# Patient Record
Sex: Female | Born: 1980 | Race: Black or African American | Hispanic: No | Marital: Married | Smoking: Former smoker
Health system: Southern US, Community
[De-identification: ages and names within clinical notes are randomized; demographics above are authoritative.]

## PROBLEM LIST (undated history)

## (undated) DIAGNOSIS — R002 Palpitations: Secondary | ICD-10-CM

## (undated) DIAGNOSIS — J9601 Acute respiratory failure with hypoxia: Secondary | ICD-10-CM

## (undated) DIAGNOSIS — M329 Systemic lupus erythematosus, unspecified: Secondary | ICD-10-CM

## (undated) DIAGNOSIS — R079 Chest pain, unspecified: Secondary | ICD-10-CM

## (undated) DIAGNOSIS — R609 Edema, unspecified: Secondary | ICD-10-CM

## (undated) DIAGNOSIS — I2699 Other pulmonary embolism without acute cor pulmonale: Secondary | ICD-10-CM

## (undated) DIAGNOSIS — I10 Essential (primary) hypertension: Secondary | ICD-10-CM

## (undated) DIAGNOSIS — D6859 Other primary thrombophilia: Secondary | ICD-10-CM

## (undated) HISTORY — DX: Palpitations: R00.2

## (undated) HISTORY — PX: OTHER SURGICAL HISTORY: SHX169

## (undated) HISTORY — PX: TONSILLECTOMY: SUR1361

## (undated) HISTORY — PX: CHOLECYSTECTOMY: SHX55

## (undated) HISTORY — DX: Edema, unspecified: R60.9

## (undated) HISTORY — DX: Acute respiratory failure with hypoxia: J96.01

## (undated) HISTORY — PX: ABDOMINAL HYSTERECTOMY: SHX81

## (undated) HISTORY — DX: Chest pain, unspecified: R07.9

## (undated) HISTORY — PX: HAND SURGERY: SHX662

---

## 2003-06-08 DIAGNOSIS — I2699 Other pulmonary embolism without acute cor pulmonale: Secondary | ICD-10-CM

## 2003-06-08 HISTORY — DX: Other pulmonary embolism without acute cor pulmonale: I26.99

## 2008-04-12 ENCOUNTER — Ambulatory Visit: Payer: Self-pay | Admitting: Hematology & Oncology

## 2008-05-13 LAB — PROTHROMBIN TIME: Prothrombin Time: 50.4 seconds — ABNORMAL HIGH (ref 11.6–15.2)

## 2008-05-13 LAB — PROTIME-INR

## 2008-05-27 ENCOUNTER — Ambulatory Visit: Payer: Self-pay | Admitting: Oncology

## 2008-05-27 LAB — PROTIME-INR
INR: 1.1 — ABNORMAL LOW (ref 2.00–3.50)
Protime: 13.2 Seconds (ref 10.6–13.4)

## 2008-07-29 ENCOUNTER — Ambulatory Visit: Payer: Self-pay | Admitting: Oncology

## 2008-09-30 ENCOUNTER — Emergency Department (HOSPITAL_COMMUNITY): Admission: EM | Admit: 2008-09-30 | Discharge: 2008-09-30 | Payer: Self-pay | Admitting: Emergency Medicine

## 2008-10-14 ENCOUNTER — Emergency Department (HOSPITAL_COMMUNITY): Admission: EM | Admit: 2008-10-14 | Discharge: 2008-10-14 | Payer: Self-pay | Admitting: Emergency Medicine

## 2010-09-15 LAB — URINALYSIS, ROUTINE W REFLEX MICROSCOPIC
Bilirubin Urine: NEGATIVE
Glucose, UA: NEGATIVE mg/dL
Ketones, ur: NEGATIVE mg/dL
pH: 7 (ref 5.0–8.0)

## 2010-09-15 LAB — COMPREHENSIVE METABOLIC PANEL
ALT: 19 U/L (ref 0–35)
AST: 17 U/L (ref 0–37)
CO2: 27 mEq/L (ref 19–32)
Calcium: 8.7 mg/dL (ref 8.4–10.5)
Chloride: 106 mEq/L (ref 96–112)
GFR calc Af Amer: >60 mL/min (ref >60)
GFR calc non Af Amer: >60 mL/min (ref >60)
Sodium: 138 mEq/L (ref 135–145)

## 2010-09-15 LAB — LIPASE, BLOOD: Lipase: 18 U/L (ref 11–59)

## 2010-09-15 LAB — URINE MICROSCOPIC-ADD ON

## 2010-09-15 LAB — DIFFERENTIAL
Eosinophils Absolute: 0.2 10*3/uL (ref 0.0–0.7)
Eosinophils Relative: 3 % (ref 0–5)
Lymphs Abs: 3 10*3/uL (ref 0.7–4.0)

## 2010-09-15 LAB — CBC
MCHC: 34 g/dL (ref 30.0–36.0)
RBC: 4.09 MIL/uL (ref 3.87–5.11)
WBC: 6.8 10*3/uL (ref 4.0–10.5)

## 2010-09-15 LAB — POCT PREGNANCY, URINE: Preg Test, Ur: NEGATIVE

## 2010-09-16 LAB — URINE MICROSCOPIC-ADD ON

## 2010-09-16 LAB — URINALYSIS, ROUTINE W REFLEX MICROSCOPIC
Nitrite: NEGATIVE
Specific Gravity, Urine: 1.009 (ref 1.005–1.030)
pH: 6.5 (ref 5.0–8.0)

## 2010-09-16 LAB — POCT PREGNANCY, URINE: Preg Test, Ur: NEGATIVE

## 2011-12-24 DIAGNOSIS — R002 Palpitations: Secondary | ICD-10-CM

## 2011-12-24 DIAGNOSIS — R609 Edema, unspecified: Secondary | ICD-10-CM

## 2011-12-24 HISTORY — DX: Edema, unspecified: R60.9

## 2011-12-24 HISTORY — DX: Palpitations: R00.2

## 2012-12-11 DIAGNOSIS — G5600 Carpal tunnel syndrome, unspecified upper limb: Secondary | ICD-10-CM | POA: Insufficient documentation

## 2013-07-16 DIAGNOSIS — E119 Type 2 diabetes mellitus without complications: Secondary | ICD-10-CM | POA: Insufficient documentation

## 2014-05-16 DIAGNOSIS — N200 Calculus of kidney: Secondary | ICD-10-CM | POA: Insufficient documentation

## 2014-05-18 DIAGNOSIS — J9601 Acute respiratory failure with hypoxia: Secondary | ICD-10-CM

## 2014-05-18 HISTORY — DX: Acute respiratory failure with hypoxia: J96.01

## 2014-06-19 DIAGNOSIS — Z86711 Personal history of pulmonary embolism: Secondary | ICD-10-CM | POA: Insufficient documentation

## 2014-08-10 DIAGNOSIS — G4733 Obstructive sleep apnea (adult) (pediatric): Secondary | ICD-10-CM | POA: Insufficient documentation

## 2015-04-24 ENCOUNTER — Emergency Department (INDEPENDENT_AMBULATORY_CARE_PROVIDER_SITE_OTHER)
Admission: EM | Admit: 2015-04-24 | Discharge: 2015-04-24 | Disposition: A | Discharge status: Home / Self Care | Payer: Medicare Other | Source: Home / Self Care | Attending: Family Medicine | Admitting: Family Medicine

## 2015-04-24 ENCOUNTER — Encounter (HOSPITAL_COMMUNITY): Payer: Self-pay | Admitting: *Deleted

## 2015-04-24 DIAGNOSIS — K0889 Other specified disorders of teeth and supporting structures: Secondary | ICD-10-CM | POA: Diagnosis not present

## 2015-04-24 HISTORY — DX: Essential (primary) hypertension: I10

## 2015-04-24 HISTORY — DX: Other pulmonary embolism without acute cor pulmonale: I26.99

## 2015-04-24 MED ORDER — AMOXICILLIN 500 MG PO CAPS: 500.0000 mg | ORAL_CAPSULE | Freq: Three times a day (TID) | ORAL | Status: DC

## 2015-04-24 MED ORDER — HYDROCODONE-ACETAMINOPHEN 5-325 MG PO TABS: 1.0000 | ORAL_TABLET | Freq: Four times a day (QID) | ORAL | Status: DC | PRN

## 2015-04-24 NOTE — Discharge Instructions (Signed)
Take medicine as prescribed, see your dentist as soon as possible °

## 2015-04-24 NOTE — ED Notes (Signed)
l  Lower  Lower  Bottom  Toothache    sensative  To  Touch  And       tempature

## 2015-04-24 NOTE — ED Provider Notes (Signed)
CSN: [id number]     Arrival date & time 04/24/15  1929 History   First MD Initiated Contact with Patient 04/24/15 1946     Chief Complaint  Patient presents with  . Dental Pain   (Consider location/radiation/quality/duration/timing/severity/associated sxs/prior Treatment) Patient is a 34 y.o. female presenting with tooth pain. The history is provided by the patient.  Dental Pain Location:  Lower Lower teeth location:  21/LL 1st bicuspid Quality:  Throbbing Severity:  Moderate Onset quality:  Sudden Duration:  3 days Progression:  Worsening Chronicity:  New Context: abscess, dental caries and poor dentition   Relieved by:  None tried Worsened by:  Nothing tried Ineffective treatments:  None tried Associated symptoms: no facial pain, no facial swelling, no fever, no neck swelling and no oral lesions   Risk factors: lack of dental care     Past Medical History  Diagnosis Date  . Pulmonary embolism (HCC)   . Hypertension    Past Surgical History  Procedure Laterality Date  . Btl    . Abdominal hysterectomy    . Cholecystectomy    . Tonsillectomy    . Hand surgery     No family history on file. Social History  Substance Use Topics  . Smoking status: Former Games developermoker  . Smokeless tobacco: None  . Alcohol Use: No   OB History    No data available     Review of Systems  Constitutional: Negative.  Negative for fever.  HENT: Positive for dental problem. Negative for facial swelling and mouth sores.   All other systems reviewed and are negative.   Allergies  Nsaids  Home Medications   Prior to Admission medications   Medication Sig Start Date End Date Taking? Authorizing Provider  AmLODIPine Besylate (NORVASC PO) Take by mouth.   Yes Historical Provider, MD  QUEtiapine Fumarate (SEROQUEL PO) Take by mouth.   Yes Historical Provider, MD  WARFARIN SODIUM PO Take by mouth.   Yes Historical Provider, MD  Zolpidem Tartrate (AMBIEN PO) Take by mouth.   Yes Historical  Provider, MD  amoxicillin (AMOXIL) 500 MG capsule Take 1 capsule (500 mg total) by mouth 3 (three) times daily. 04/24/15   Linna HoffJames D Amaurie Wandel, MD  HYDROcodone-acetaminophen (NORCO/VICODIN) 5-325 MG tablet Take 1 tablet by mouth every 6 (six) hours as needed. For dental pain 04/24/15   Linna HoffJames D Chavis Tessler, MD   Meds Ordered and Administered this Visit  Medications - No data to display  BP 164/84 mmHg  Pulse 102  Temp(Src) 98.2 F (36.8 C) (Oral)  Resp 16  SpO2 98% No data found.   Physical Exam  Constitutional: She is oriented to person, place, and time. She appears well-developed and well-nourished. She appears distressed.  HENT:  Right Ear: External ear normal.  Left Ear: External ear normal.  Mouth/Throat: Uvula is midline, oropharynx is clear and moist and mucous membranes are normal.    Neck: Normal range of motion. Neck supple.  Lymphadenopathy:    She has no cervical adenopathy.  Neurological: She is alert and oriented to person, place, and time.  Skin: Skin is warm and dry.  Nursing note and vitals reviewed.   ED Course  Procedures (including critical care time)  Labs Review Labs Reviewed - No data to display  Imaging Review No results found.   Visual Acuity Review  Right Eye Distance:   Left Eye Distance:   Bilateral Distance:    Right Eye Near:   Left Eye Near:    Bilateral  Near:         MDM   1. Pain, dental        Linna Hoff, MD 04/24/15 2002

## 2015-05-12 ENCOUNTER — Emergency Department (HOSPITAL_COMMUNITY): Payer: Medicare Other

## 2015-05-12 ENCOUNTER — Observation Stay (HOSPITAL_COMMUNITY)
Admission: EM | Admit: 2015-05-12 | Discharge: 2015-05-14 | Disposition: A | Discharge status: Home / Self Care | Payer: Medicare Other | Attending: Internal Medicine | Admitting: Internal Medicine

## 2015-05-12 ENCOUNTER — Encounter (HOSPITAL_COMMUNITY): Payer: Self-pay | Admitting: Emergency Medicine

## 2015-05-12 DIAGNOSIS — M329 Systemic lupus erythematosus, unspecified: Secondary | ICD-10-CM | POA: Diagnosis present

## 2015-05-12 DIAGNOSIS — I10 Essential (primary) hypertension: Secondary | ICD-10-CM | POA: Diagnosis present

## 2015-05-12 DIAGNOSIS — R079 Chest pain, unspecified: Secondary | ICD-10-CM | POA: Diagnosis present

## 2015-05-12 DIAGNOSIS — Z7901 Long term (current) use of anticoagulants: Secondary | ICD-10-CM

## 2015-05-12 DIAGNOSIS — E669 Obesity, unspecified: Secondary | ICD-10-CM | POA: Insufficient documentation

## 2015-05-12 DIAGNOSIS — Z86711 Personal history of pulmonary embolism: Secondary | ICD-10-CM | POA: Insufficient documentation

## 2015-05-12 DIAGNOSIS — D6859 Other primary thrombophilia: Secondary | ICD-10-CM | POA: Diagnosis not present

## 2015-05-12 DIAGNOSIS — Z87891 Personal history of nicotine dependence: Secondary | ICD-10-CM | POA: Diagnosis not present

## 2015-05-12 DIAGNOSIS — Z79899 Other long term (current) drug therapy: Secondary | ICD-10-CM | POA: Insufficient documentation

## 2015-05-12 HISTORY — DX: Morbid (severe) obesity due to excess calories: E66.01

## 2015-05-12 HISTORY — DX: Other primary thrombophilia: D68.59

## 2015-05-12 HISTORY — DX: Systemic lupus erythematosus, unspecified: M32.9

## 2015-05-12 LAB — BASIC METABOLIC PANEL
ANION GAP: 5 (ref 5–15)
BUN: 9 mg/dL (ref 6–20)
CALCIUM: 9.2 mg/dL (ref 8.9–10.3)
CO2: 27 mmol/L (ref 22–32)
Chloride: 107 mmol/L (ref 101–111)
Creatinine, Ser: 0.95 mg/dL (ref 0.44–1.00)
GLUCOSE: 101 mg/dL — AB (ref 65–99)
POTASSIUM: 4 mmol/L (ref 3.5–5.1)
Sodium: 139 mmol/L (ref 135–145)

## 2015-05-12 LAB — I-STAT TROPONIN, ED: TROPONIN I, POC: 0 ng/mL (ref 0.00–0.08)

## 2015-05-12 LAB — CBC
HEMATOCRIT: 35.8 % — AB (ref 36.0–46.0)
HEMOGLOBIN: 11.5 g/dL — AB (ref 12.0–15.0)
MCH: 28.8 pg (ref 26.0–34.0)
MCHC: 32.1 g/dL (ref 30.0–36.0)
MCV: 89.7 fL (ref 78.0–100.0)
Platelets: 282 10*3/uL (ref 150–400)
RBC: 3.99 MIL/uL (ref 3.87–5.11)
RDW: 15.5 % (ref 11.5–15.5)
WBC: 8.4 10*3/uL (ref 4.0–10.5)

## 2015-05-12 NOTE — ED Notes (Signed)
Pt. reports intermittent left chest pain with SOB , occasional dry cough and and nausea onset last night, denies diaphoresis .

## 2015-05-13 ENCOUNTER — Encounter (HOSPITAL_COMMUNITY): Payer: Self-pay | Admitting: General Practice

## 2015-05-13 ENCOUNTER — Observation Stay (HOSPITAL_COMMUNITY): Payer: Medicare Other

## 2015-05-13 ENCOUNTER — Emergency Department (HOSPITAL_COMMUNITY): Payer: Medicare Other

## 2015-05-13 DIAGNOSIS — I10 Essential (primary) hypertension: Secondary | ICD-10-CM | POA: Diagnosis not present

## 2015-05-13 DIAGNOSIS — Z7901 Long term (current) use of anticoagulants: Secondary | ICD-10-CM

## 2015-05-13 DIAGNOSIS — M329 Systemic lupus erythematosus, unspecified: Secondary | ICD-10-CM | POA: Diagnosis present

## 2015-05-13 DIAGNOSIS — D6859 Other primary thrombophilia: Secondary | ICD-10-CM

## 2015-05-13 DIAGNOSIS — R079 Chest pain, unspecified: Secondary | ICD-10-CM | POA: Diagnosis not present

## 2015-05-13 DIAGNOSIS — R0789 Other chest pain: Secondary | ICD-10-CM | POA: Diagnosis not present

## 2015-05-13 DIAGNOSIS — G4700 Insomnia, unspecified: Secondary | ICD-10-CM

## 2015-05-13 HISTORY — DX: Chest pain, unspecified: R07.9

## 2015-05-13 LAB — TROPONIN I
Troponin I: <0.03 ng/mL (ref <0.031)
Troponin I: <0.03 ng/mL (ref <0.031)

## 2015-05-13 LAB — LIPID PANEL
CHOL/HDL RATIO: 5.6 ratio
Cholesterol: 180 mg/dL (ref 0–200)
HDL: 32 mg/dL — AB (ref >40)
LDL CALC: 133 mg/dL — AB (ref 0–99)
TRIGLYCERIDES: 77 mg/dL (ref <150)
VLDL: 15 mg/dL (ref 0–40)

## 2015-05-13 LAB — MRSA PCR SCREENING: MRSA BY PCR: NEGATIVE

## 2015-05-13 LAB — SEDIMENTATION RATE
SED RATE: 23 mm/h — AB (ref 0–22)
Sed Rate: 17 mm/hr (ref 0–22)

## 2015-05-13 LAB — C-REACTIVE PROTEIN
CRP: 0.6 mg/dL (ref <1.0)
CRP: <0.5 mg/dL (ref <1.0)

## 2015-05-13 LAB — I-STAT BETA HCG BLOOD, ED (MC, WL, AP ONLY): I-stat hCG, quantitative: <5 m[IU]/mL (ref <5)

## 2015-05-13 LAB — PROTIME-INR
INR: 4.99 — ABNORMAL HIGH (ref 0.00–1.49)
PROTHROMBIN TIME: 44.9 s — AB (ref 11.6–15.2)

## 2015-05-13 MED ORDER — HYDROXYCHLOROQUINE SULFATE 200 MG PO TABS
200.0000 mg | ORAL_TABLET | Freq: Every day | ORAL | Status: DC
  Administered 2015-05-13 – 2015-05-14 (×2): 200 mg via ORAL
  Filled 2015-05-13 (×2): qty 1

## 2015-05-13 MED ORDER — IOHEXOL 350 MG/ML SOLN
100.0000 mL | Freq: Once | INTRAVENOUS | Status: AC | PRN
  Administered 2015-05-13: 100 mL via INTRAVENOUS

## 2015-05-13 MED ORDER — WARFARIN - PHARMACIST DOSING INPATIENT: Freq: Every day | Status: DC

## 2015-05-13 MED ORDER — NITROGLYCERIN 0.4 MG SL SUBL
0.4000 mg | SUBLINGUAL_TABLET | SUBLINGUAL | Status: DC | PRN
  Filled 2015-05-13 (×2): qty 1

## 2015-05-13 MED ORDER — ACETAMINOPHEN 325 MG PO TABS
650.0000 mg | ORAL_TABLET | ORAL | Status: DC | PRN
  Administered 2015-05-13 – 2015-05-14 (×3): 650 mg via ORAL
  Filled 2015-05-13 (×2): qty 2

## 2015-05-13 MED ORDER — SODIUM CHLORIDE 0.9 % IV SOLN
INTRAVENOUS | Status: DC
  Administered 2015-05-13 (×2): via INTRAVENOUS

## 2015-05-13 MED ORDER — IPRATROPIUM-ALBUTEROL 0.5-2.5 (3) MG/3ML IN SOLN
3.0000 mL | Freq: Once | RESPIRATORY_TRACT | Status: AC
  Administered 2015-05-13: 3 mL via RESPIRATORY_TRACT
  Filled 2015-05-13: qty 3

## 2015-05-13 MED ORDER — REGADENOSON 0.4 MG/5ML IV SOLN
0.4000 mg | Freq: Once | INTRAVENOUS | Status: AC
  Administered 2015-05-13: 0.4 mg via INTRAVENOUS
  Filled 2015-05-13: qty 5

## 2015-05-13 MED ORDER — ALBUTEROL SULFATE (2.5 MG/3ML) 0.083% IN NEBU: 2.5000 mg | INHALATION_SOLUTION | Freq: Four times a day (QID) | RESPIRATORY_TRACT | Status: DC | PRN

## 2015-05-13 MED ORDER — DIPHENHYDRAMINE HCL 50 MG/ML IJ SOLN
25.0000 mg | Freq: Once | INTRAMUSCULAR | Status: AC
  Administered 2015-05-13: 25 mg via INTRAVENOUS
  Filled 2015-05-13: qty 1

## 2015-05-13 MED ORDER — METOCLOPRAMIDE HCL 5 MG/ML IJ SOLN
10.0000 mg | Freq: Once | INTRAMUSCULAR | Status: AC
  Administered 2015-05-13: 10 mg via INTRAVENOUS
  Filled 2015-05-13: qty 2

## 2015-05-13 MED ORDER — AMLODIPINE BESYLATE 5 MG PO TABS
5.0000 mg | ORAL_TABLET | Freq: Every day | ORAL | Status: DC
  Administered 2015-05-13 – 2015-05-14 (×2): 5 mg via ORAL
  Filled 2015-05-13 (×2): qty 1

## 2015-05-13 MED ORDER — TECHNETIUM TC 99M SESTAMIBI GENERIC - CARDIOLITE
30.0000 | Freq: Once | INTRAVENOUS | Status: AC | PRN
  Administered 2015-05-13: 30 via INTRAVENOUS

## 2015-05-13 MED ORDER — METOPROLOL SUCCINATE ER 25 MG PO TB24
25.0000 mg | ORAL_TABLET | Freq: Every day | ORAL | Status: DC
  Administered 2015-05-13 – 2015-05-14 (×2): 25 mg via ORAL
  Filled 2015-05-13 (×2): qty 1

## 2015-05-13 MED ORDER — GI COCKTAIL ~~LOC~~
30.0000 mL | Freq: Four times a day (QID) | ORAL | Status: DC | PRN
  Administered 2015-05-13 – 2015-05-14 (×2): 30 mL via ORAL
  Filled 2015-05-13 (×2): qty 30

## 2015-05-13 MED ORDER — KETOROLAC TROMETHAMINE 30 MG/ML IJ SOLN
30.0000 mg | Freq: Once | INTRAMUSCULAR | Status: AC
  Administered 2015-05-13: 30 mg via INTRAVENOUS
  Filled 2015-05-13: qty 1

## 2015-05-13 MED ORDER — ACETAMINOPHEN 325 MG PO TABS
ORAL_TABLET | ORAL | Status: AC
  Filled 2015-05-13: qty 2

## 2015-05-13 MED ORDER — PREDNISONE 20 MG PO TABS
40.0000 mg | ORAL_TABLET | Freq: Every day | ORAL | Status: DC
  Administered 2015-05-14: 40 mg via ORAL
  Filled 2015-05-13: qty 2

## 2015-05-13 MED ORDER — REGADENOSON 0.4 MG/5ML IV SOLN
INTRAVENOUS | Status: AC
  Administered 2015-05-13: 0.4 mg via INTRAVENOUS
  Filled 2015-05-13: qty 5

## 2015-05-13 MED ORDER — ALBUTEROL SULFATE (2.5 MG/3ML) 0.083% IN NEBU
2.5000 mg | INHALATION_SOLUTION | Freq: Four times a day (QID) | RESPIRATORY_TRACT | Status: DC
  Filled 2015-05-13: qty 3

## 2015-05-13 MED ORDER — ZOLPIDEM TARTRATE 5 MG PO TABS
5.0000 mg | ORAL_TABLET | Freq: Every day | ORAL | Status: DC
  Administered 2015-05-13: 5 mg via ORAL
  Filled 2015-05-13: qty 1

## 2015-05-13 MED ORDER — QUETIAPINE FUMARATE 50 MG PO TABS
100.0000 mg | ORAL_TABLET | Freq: Every day | ORAL | Status: DC
  Administered 2015-05-13: 100 mg via ORAL
  Filled 2015-05-13: qty 2

## 2015-05-13 MED ORDER — MORPHINE SULFATE (PF) 2 MG/ML IV SOLN
2.0000 mg | INTRAVENOUS | Status: DC | PRN
  Administered 2015-05-13 (×4): 2 mg via INTRAVENOUS
  Filled 2015-05-13 (×4): qty 1

## 2015-05-13 MED ORDER — ONDANSETRON HCL 4 MG/2ML IJ SOLN: 4.0000 mg | Freq: Four times a day (QID) | INTRAMUSCULAR | Status: DC | PRN

## 2015-05-13 NOTE — ED Provider Notes (Signed)
CSN: [id number]     Arrival date & time 05/12/15  2159 History  By signing my name below, I, Freida Busmaniana Omoyeni, attest that this documentation has been prepared under the direction and in the presence of Tomasita CrumbleAdeleke Hikeem Andersson, MD . Electronically Signed: Freida Busmaniana Omoyeni, Scribe. 05/13/2015. 1:31 AM.  Chief Complaint  Patient presents with  . Chest Pain   The history is provided by the patient. No language interpreter was used.    HPI Comments:  Ezzie Duralierra P Dickens-Hunt is a 34 y.o. female who presents to the Emergency Department complaining of CP that began 2 nights ago (05/11/15). She describes chest tightness and "massive pounding" in her chest with movement. Pt notes she woke up yesterday AM (05/12/15) with associated LUE soreness and diaphoretic.  Pt also notes mild nausea. She denies vomiting. Her pain is exacerbated with deep inspiration.No alleviating factors noted. Pt denies recent surgery, long periods of immobilization and hormone replacement therapy. Pt reports h/o protein S deficiency and notes she is currently on coumadin secondary to  PE in 2005. She states her episode today is dissimilar to past PE. Pt is scheduled to have a stress test on 05/14/2015   Past Medical History  Diagnosis Date  . Pulmonary embolism (HCC)   . Hypertension   . Lupus (systemic lupus erythematosus) (HCC)   . Protein S deficiency (HCC)   . Obesity    Past Surgical History  Procedure Laterality Date  . Btl    . Abdominal hysterectomy    . Cholecystectomy    . Tonsillectomy    . Hand surgery     No family history on file. Social History  Substance Use Topics  . Smoking status: Former Games developermoker  . Smokeless tobacco: None  . Alcohol Use: No   OB History    No data available     Review of Systems  10 systems reviewed and all are negative for acute change except as noted in the HPI.    Allergies  Nsaids and Topamax  Home Medications   Prior to Admission medications   Medication Sig Start Date End Date Taking?  Authorizing Provider  amLODipine (NORVASC) 5 MG tablet Take 5 mg by mouth daily.   Yes Historical Provider, MD  metoprolol succinate (TOPROL-XL) 25 MG 24 hr tablet Take 25 mg by mouth daily.   Yes Historical Provider, MD  QUEtiapine (SEROQUEL) 100 MG tablet Take 100 mg by mouth at bedtime.   Yes Historical Provider, MD  warfarin (COUMADIN) 5 MG tablet Take 10-15 mg by mouth See admin instructions. Alternate taking 2 tablets one day then take 3 tablets the next day   Yes Historical Provider, MD  zolpidem (AMBIEN) 10 MG tablet Take 10 mg by mouth at bedtime.   Yes Historical Provider, MD  amoxicillin (AMOXIL) 500 MG capsule Take 1 capsule (500 mg total) by mouth 3 (three) times daily. Patient not taking: Reported on 05/13/2015 04/24/15   Linna HoffJames D Kindl, MD  HYDROcodone-acetaminophen (NORCO/VICODIN) 5-325 MG tablet Take 1 tablet by mouth every 6 (six) hours as needed. For dental pain Patient not taking: Reported on 05/13/2015 04/24/15   Linna HoffJames D Kindl, MD   BP 138/95 mmHg  Pulse 82  Temp(Src) 98.2 F (36.8 C) (Oral)  Resp 29  SpO2 99% Physical Exam  Constitutional: She is oriented to person, place, and time. No distress.  Obese  HENT:  Head: Normocephalic and atraumatic.  Nose: Nose normal.  Mouth/Throat: Oropharynx is clear and moist. No oropharyngeal exudate.  Eyes: Conjunctivae  and EOM are normal. Pupils are equal, round, and reactive to light. No scleral icterus.  Neck: Normal range of motion. Neck supple. No JVD present. No tracheal deviation present. No thyromegaly present.  Cardiovascular: Normal rate, regular rhythm and normal heart sounds.  Exam reveals no gallop and no friction rub.   No murmur heard. Pulmonary/Chest: Effort normal and breath sounds normal. No respiratory distress. She has no wheezes. She exhibits no tenderness.  Abdominal: Soft. Bowel sounds are normal. She exhibits no distension and no mass. There is no tenderness. There is no rebound and no guarding.   Musculoskeletal: Normal range of motion. She exhibits no edema or tenderness.  Lymphadenopathy:    She has no cervical adenopathy.  Neurological: She is alert and oriented to person, place, and time. No cranial nerve deficit. She exhibits normal muscle tone.  Skin: Skin is warm and dry. No rash noted. No erythema. No pallor.  Nursing note and vitals reviewed.   ED Course  Procedures   DIAGNOSTIC STUDIES:  Oxygen Saturation is 98% on RA, normal by my interpretation.    COORDINATION OF CARE:  1:31 AM Discussed treatment plan with pt at bedside and pt agreed to plan.  Labs Review Labs Reviewed  BASIC METABOLIC PANEL - Abnormal; Notable for the following:    Glucose, Bld 101 (*)    All other components within normal limits  CBC - Abnormal; Notable for the following:    Hemoglobin 11.5 (*)    HCT 35.8 (*)    All other components within normal limits  PROTIME-INR - Abnormal; Notable for the following:    Prothrombin Time 44.9 (*)    INR 4.99 (*)    All other components within normal limits  I-STAT TROPOININ, ED  I-STAT BETA HCG BLOOD, ED ([facility], [facility], [facility] ONLY)    Imaging Review Dg Chest 2 View  05/12/2015  CLINICAL DATA:  34 year old female with chest pain and shortness of breath EXAM: CHEST  2 VIEW COMPARISON:  Radiograph dated 10/14/2008 FINDINGS: The heart size and mediastinal contours are within normal limits. Both lungs are clear. The visualized skeletal structures are unremarkable. IMPRESSION: No active cardiopulmonary disease. Electronically Signed   By: Elgie Collard M.D.   On: 05/12/2015 22:58   Ct Angio Chest Pe W/cm &/or Wo Cm  05/13/2015  CLINICAL DATA:  34 year old female with intermittent chest pain and shortness of breath EXAM: CT ANGIOGRAPHY CHEST WITH CONTRAST TECHNIQUE: Multidetector CT imaging of the chest was performed using the standard protocol during bolus administration of intravenous contrast. Multiplanar CT image reconstructions and MIPs were obtained to  evaluate the vascular anatomy. CONTRAST:  OMNIPAQUE IOHEXOL 350 MG/ML SOLN COMPARISON:  Chest radiograph dated 05/12/2015 FINDINGS: The lungs are clear. No pleural effusion. The central airways are patent. The thoracic aorta appears unremarkable. Evaluation of the pulmonary arteries is limited due to suboptimal opacification of the peripheral branches. No central pulmonary artery embolus identified. There is prominence of the main pulmonary trunk concerning for a degree of pulmonary hypertension. Top-normal cardiac size. No pericardial effusion. The visualized esophagus and thyroid gland appear unremarkable. There is no axillary adenopathy. The chest wall soft tissues appear unremarkable. The osseous structures are intact. The visualized upper abdomen appear unremarkable. Review of the MIP images confirms the above findings. IMPRESSION: Suboptimal opacification of the peripheral pulmonary arteries. No CT evidence of central pulmonary artery embolus. Electronically Signed   By: Elgie Collard M.D.   On: 05/13/2015 01:55   I have personally reviewed and  evaluated these images and lab results as part of my medical decision-making.   EKG Interpretation   Date/Time:  Monday May 12 2015 22:00:14 EST Ventricular Rate:  89 PR Interval:  164 QRS Duration: 88 QT Interval:  360 QTC Calculation: 438 R Axis:   63 Text Interpretation:  Normal sinus rhythm Normal ECG No old tracing to  compare Confirmed by Erroll Luna [phone]) on 05/13/2015 12:30:29  AM      MDM   Final diagnoses:  None   Patient presents emergency department for chest pain. She states she has shortness of breath with it. She has seen a cardiologist in [place] was recommended she get a stress test performed on Wednesday. Her symptoms have gotten worse. She is compliant with her Coumadin however her INR is 5. CT scan of the chest was negative for pulmonary embolism.  Patient will need to be admitted for further cardiac  workup and also to have her INR stabilized. I have patient try hospitalist for admission.   I personally performed the services described in this documentation, which was scribed in my presence. The recorded information has been reviewed and is accurate.      Tomasita Crumble, MD 05/13/15 [phone]

## 2015-05-13 NOTE — Progress Notes (Signed)
ANTICOAGULATION CONSULT NOTE - Initial Consult  Pharmacy Consult for Coumadin Indication: PE 2005, prot S deficiency  Allergies  Allergen Reactions  . Nsaids Other (See Comments)    "not suppose to take due to being on blood thinners"  . Topamax [Topiramate] Other (See Comments)    "makes face numb"     Patient Measurements:    Vital Signs: Temp: 98.2 F (36.8 C) (12/05 2208) Temp Source: Oral (12/05 2208) BP: 120/75 mmHg (12/06 0300) Pulse Rate: 76 (12/06 0300)  Labs:  Recent Labs  05/12/15 2232 05/13/15 0054  HGB 11.5*  --   HCT 35.8*  --   PLT 282  --   LABPROT  --  44.9*  INR  --  4.99*  CREATININE 0.95  --     CrCl cannot be calculated (Unknown ideal weight.).   Medical History: Past Medical History  Diagnosis Date  . Pulmonary embolism (HCC)   . Hypertension   . Lupus (systemic lupus erythematosus) (HCC)   . Protein S deficiency (HCC)   . Obesity     Medications:  See electronic med rec  Assessment: 34 y.o. F presents with CP. Pt on coumadin PTA for h/o PE 2005, prot s deficiency. Home dose: 10mg  alternating with 15mg  - took 15mg  on 12/4. INR on admit 4.99. Noted that pt goal INR 3-4 (she reports this is her goal per her MD). CT chest neg for PE. CBC stable on admission.  Goal of Therapy:  INR 3-4 Monitor platelets by anticoagulation protocol: Yes   Plan:  Daily INR Hold coumadin 12/6 p.m.  Christoper Fabianaron Mikalia Fessel, PharmD, BCPS Clinical pharmacist, pager [phone] 05/13/2015,3:47 AM

## 2015-05-13 NOTE — Progress Notes (Addendum)
FYI - patient will be a 2 DAY NUCLEAR STRESS TEST. Today is day 1 of 2.  Results will be available tomorrow. Dr. Duke Salviaandolph feels patient can attempt exercise. Dayna Dunn PA-C

## 2015-05-13 NOTE — H&P (Addendum)
[facility] History and Physical  Shelley Bowman KCL:[id number] DOB: 14-Feb-1981 DOA: 05/12/2015  Referring physician: ED PCP: Pcp Not In System   Chief Complaint: Chest Pain  HPI:  Shelley Bowman is a 34 year old female with a past medical history significant for lupus, PE in 2005, protein S deficiency on chronic anticoagulation therapy, and hypertension; who presents with acute onset of chest pain. Symptoms started initially 2 nights ago. Describes symptoms as chest tightness on the left-hand side. Yesterday morning she woke up drenched in sweat. Patient reports taking her blood pressure medications with no relief of symptoms. Pain worsen with movement and taking deep breaths. She has not been able to try anything that has relieved symptoms. Associated symptoms include left arm swelling. Patient notes that she was scheduled to have a stress test on 05/14/2015. She is on Coumadin for a history of protein S deficiency and PE back in 2005. Her INR goal is between 3 and 4. She notes the only change in medications has been the addition of metoprolol which was started last week. Upon admission into the emergency department the patient was evaluated with a EKG (normal sinus rhythm), CT angiogram of the chest (showing no central pulmonary embolus), and troponin (negative).  Patient declined nitroglycerin secondary to side effects of headache.    Review of Systems  Constitutional: Positive for diaphoresis. Negative for chills.  HENT: Negative for ear pain and tinnitus.   Eyes: Negative for photophobia and pain.  Respiratory: Positive for shortness of breath. Negative for sputum production.   Cardiovascular: Positive for chest pain, palpitations and leg swelling.  Gastrointestinal: Negative for vomiting and abdominal pain.  Genitourinary: Negative for urgency and frequency.  Musculoskeletal: Positive for myalgias and joint pain.  Skin: Negative for itching and rash.  Neurological:  Negative for tremors, speech change and weakness.  Endo/Heme/Allergies: Negative for polydipsia. Bruises/bleeds easily.  Psychiatric/Behavioral: Negative for suicidal ideas and substance abuse.    Past Medical History  Diagnosis Date  . Pulmonary embolism ([place])   . Hypertension   . Lupus (systemic lupus erythematosus) ([place])   . Protein S deficiency ([place])   . Obesity      Past Surgical History  Procedure Laterality Date  . Btl    . Abdominal hysterectomy    . Cholecystectomy    . Tonsillectomy    . Hand surgery      Social History:  reports that she has quit smoking. She does not have any smokeless tobacco history on file. She reports that she does not drink alcohol or use illicit drugs. Where does patient live--home  Can patient participate in ADLs? Yes  Allergies  Allergen Reactions  . Nsaids Other (See Comments)    "not suppose to take due to being on blood thinners"  . Topamax [Topiramate] Other (See Comments)    "makes face numb"     No family history on file.      Prior to Admission medications   Medication Sig Start Date End Date Taking? Authorizing Provider  amLODipine (NORVASC) 5 MG tablet Take 5 mg by mouth daily.   Yes Historical Provider, MD  metoprolol succinate (TOPROL-XL) 25 MG 24 hr tablet Take 25 mg by mouth daily.   Yes Historical Provider, MD  QUEtiapine (SEROQUEL) 100 MG tablet Take 100 mg by mouth at bedtime.   Yes Historical Provider, MD  warfarin (COUMADIN) 5 MG tablet Take 10-15 mg by mouth See admin instructions. Alternate taking 2 tablets one day then take 3 tablets the next  day   Yes Historical Provider, MD  zolpidem (AMBIEN) 10 MG tablet Take 10 mg by mouth at bedtime.   Yes Historical Provider, MD  amoxicillin (AMOXIL) 500 MG capsule Take 1 capsule (500 mg total) by mouth 3 (three) times daily. Patient not taking: Reported on 05/13/2015 04/24/15   Billy Fischer, MD  HYDROcodone-acetaminophen (NORCO/VICODIN) 5-325 MG tablet Take 1 tablet by  mouth every 6 (six) hours as needed. For dental pain Patient not taking: Reported on 05/13/2015 04/24/15   Billy Fischer, MD     Physical Exam: Filed Vitals:   05/13/15 0101 05/13/15 0115 05/13/15 0230 05/13/15 0245  BP:  138/95 130/94 134/96  Pulse: 79 82 77 77  Temp:      TempSrc:      Resp: 26 29 21 17   SpO2: 100% 99% 100% 100%     Constitutional: Vital signs reviewed. Patient obese female in mild distress, but cooperative with exam. Alert and oriented x3.  Head: Normocephalic and atraumatic  Ear: TM normal bilaterally  Mouth: no erythema or exudates, MMM  Eyes: PERRL, EOMI, conjunctivae normal, No scleral icterus.  Neck: Supple, Trachea midline normal ROM, No JVD, mass, thyromegaly, or carotid bruit present.  Cardiovascular: RRR, S1 normal, S2 normal, no MRG, pulses symmetric and intact bilaterally. Pain not reproducible with palpation on physical exam  Pulmonary/Chest: CTAB, no wheezes, rales, or rhonchi  Abdominal: Soft. Non-tender, non-distended, bowel sounds are normal, no masses, organomegaly, or guarding present.  GU: no CVA tenderness Musculoskeletal: No joint deformities, erythema, or stiffness, ROM full and no nontender Ext: +1 edema of the left upper extremity with +2 pitting edema of the lower extremities bilaterally. No cyanosis, pulses palpable bilaterally (DP and PT)  Hematology: no cervical, inginal, or axillary adenopathy.  Neurological: A&O x3, Strenght is normal and symmetric bilaterally, cranial nerve II-XII are grossly intact, no focal motor deficit, sensory intact to light touch bilaterally.  Skin: Warm, dry and intact. No rash, cyanosis, or clubbing.  Psychiatric: Normal mood and affect. speech and behavior is normal. Judgment and thought content normal. Cognition and memory are normal.      Data Review   Micro Results No results found for this or any previous visit (from the past 240 hour(s)).  Radiology Reports Dg Chest 2 View  05/12/2015   CLINICAL DATA:  34 year old female with chest pain and shortness of breath EXAM: CHEST  2 VIEW COMPARISON:  Radiograph dated 10/14/2008 FINDINGS: The heart size and mediastinal contours are within normal limits. Both lungs are clear. The visualized skeletal structures are unremarkable. IMPRESSION: No active cardiopulmonary disease. Electronically Signed   By: Anner Crete M.D.   On: 05/12/2015 22:58   Ct Angio Chest Pe W/cm &/or Wo Cm  05/13/2015  CLINICAL DATA:  34 year old female with intermittent chest pain and shortness of breath EXAM: CT ANGIOGRAPHY CHEST WITH CONTRAST TECHNIQUE: Multidetector CT imaging of the chest was performed using the standard protocol during bolus administration of intravenous contrast. Multiplanar CT image reconstructions and MIPs were obtained to evaluate the vascular anatomy. CONTRAST:  170m OMNIPAQUE IOHEXOL 350 MG/ML SOLN COMPARISON:  Chest radiograph dated 05/12/2015 FINDINGS: The lungs are clear. No pleural effusion. The central airways are patent. The thoracic aorta appears unremarkable. Evaluation of the pulmonary arteries is limited due to suboptimal opacification of the peripheral branches. No central pulmonary artery embolus identified. There is prominence of the main pulmonary trunk concerning for a degree of pulmonary hypertension. Top-normal cardiac size. No pericardial effusion. The visualized esophagus  and thyroid gland appear unremarkable. There is no axillary adenopathy. The chest wall soft tissues appear unremarkable. The osseous structures are intact. The visualized upper abdomen appear unremarkable. Review of the MIP images confirms the above findings. IMPRESSION: Suboptimal opacification of the peripheral pulmonary arteries. No CT evidence of central pulmonary artery embolus. Electronically Signed   By: Anner Crete M.D.   On: 05/13/2015 01:55     CBC  Recent Labs Lab 05/12/15 2232  WBC 8.4  HGB 11.5*  HCT 35.8*  PLT 282  MCV 89.7  MCH 28.8   MCHC 32.1  RDW 15.5    Chemistries   Recent Labs Lab 05/12/15 2232  NA 139  K 4.0  CL 107  CO2 27  GLUCOSE 101*  BUN 9  CREATININE 0.95  CALCIUM 9.2   ------------------------------------------------------------------------------------------------------------------ CrCl cannot be calculated (Unknown ideal weight.). ------------------------------------------------------------------------------------------------------------------ No results for input(s): HGBA1C in the last 72 hours. ------------------------------------------------------------------------------------------------------------------ No results for input(s): CHOL, HDL, LDLCALC, TRIG, CHOLHDL, LDLDIRECT in the last 72 hours. ------------------------------------------------------------------------------------------------------------------ No results for input(s): TSH, T4TOTAL, T3FREE, THYROIDAB in the last 72 hours.  Invalid input(s): FREET3 ------------------------------------------------------------------------------------------------------------------ No results for input(s): VITAMINB12, FOLATE, FERRITIN, TIBC, IRON, RETICCTPCT in the last 72 hours.  Coagulation profile  Recent Labs Lab 05/13/15 0054  INR 4.99*    No results for input(s): DDIMER in the last 72 hours.  Cardiac Enzymes No results for input(s): CKMB, TROPONINI, MYOGLOBIN in the last 168 hours.  Invalid input(s): CK ------------------------------------------------------------------------------------------------------------------ Invalid input(s): POCBNP   CBG: No results for input(s): GLUCAP in the last 168 hours.     EKG: Independently reviewed. Normal sinus rhythm   Assessment/Plan  Acute chest pain: Patient with reports of acute tightness of the chest with associated symptoms of shortness of breath and inability to walk significant distances without chest pain. Heart score is approximately 4 given history of presentation and  risk factors. Initial troponins 0.0 EKG normal sinus rhythm. CTA of the chest showed no acute signs of a central pulmonary embolus -Admit to stepdown as patient reports active chest pain -Repeat EKG in a.m. -troponin's x3 q 6hrs -Check lipid panel -NPO for possible stress test in am -Nitroglycerin and morphine prn pain -Albuterol neb prn SOB     Lupus (systemic lupus erythematosus) : Patient is not on any medications for her history of lupus. There were some mild signs of swelling of the left upper extremity. However also noted a supratherapeutic INR. -Check ESR and CRP  Protein S deficiency on chronic anticoagulation therapy: INR goal is reported to be between 3 and 4. On presentation today her INR was elevated at 4.99. Patient notes taking 15 mg of the night previously. -Pharmacy to dose Coumadin  Hypertension: Stable -continue metoprolol and amlodipine  History of PE: Patient reports history of PE back in 2005 -See above  Insomnia -Continue Ambien  Code Status:   full Family Communication: bedside Disposition Plan: admit   Total time spent 55 minutes.Greater than 50% of this time was spent in counseling, explanation of diagnosis, planning of further management, and coordination of care  [facility] Pager [phone]  If 7PM-7AM, please contact night-coverage [email or web address] Password [email or web address] 05/13/2015, 3:04 AM

## 2015-05-13 NOTE — Consult Note (Signed)
Cardiology Consultation Note  Patient ID: Shelley Bowman, MRN: [id number], DOB/AGE: [birth year] 35 y.o. Admit date: 05/12/2015   Date of Consult: 05/13/2015 Primary Physician: Pcp Not In System Primary Cardiologist: Previously seen by [facility]  Chief Complaint: chest pain Reason for Consultation: chest pain  HPI: [name] is a 35 y/o F with history of PE in 2005 with subsequent diagnoses of lupus and protein S deficiency, obesity, HTN, DM (although not on home meds and glucose OK), morbid obesity and former tobacco abuse (11 years) who presented to [facility] with chest pain. She has no history of heart disease - reports a stress test about a year ago that was normal. She has had intermittent chest pain for several months and was scheduled for a stress test tomorrow at [facility]. INR is followed by her hematologist and per patient, goal INR 3-4. On Sunday night 12/4 while getting ready for bed she began to notice a sensation of tightness in chest. She went to sleep and periodically would awaken with sensation of heart pounding and night sweats. When she woke up her left arm was sore. She noticed that when she would move around the chest pain would return for several minutes. This was associated with SOB. It has also been worse with palpation of the chest wall as well as very slight movements in bed. She had recurrence of palpitations just a short while ago. Telemetry reveals NSR. Workup thus far notable for neg trop x 2, LDL 133, CRP 0.6, ESR 23, INR 4.99, Hgb 11.5, neg upreg. CXR nonacute and CTA did not show any central PEs (suboptimal opacification). VSS. FHx + CAD in a great grandfather, otherwise +HTN in father.  Past Medical History  Diagnosis Date  . Pulmonary embolism ([place]) 2005  . Hypertension   . Lupus (systemic lupus erythematosus) ([place])   . Protein S deficiency ([place])     a. goal INR 3-4 per patient.  . Obesity       Surgical History:  Past Surgical  History  Procedure Laterality Date  . Btl    . Abdominal hysterectomy    . Cholecystectomy    . Tonsillectomy    . Hand surgery       Home Meds: Prior to Admission medications   Medication Sig Start Date End Date Taking? Authorizing Provider  amLODipine (NORVASC) 5 MG tablet Take 5 mg by mouth daily.   Yes Historical Provider, MD  metoprolol succinate (TOPROL-XL) 25 MG 24 hr tablet Take 25 mg by mouth daily.   Yes Historical Provider, MD  QUEtiapine (SEROQUEL) 100 MG tablet Take 100 mg by mouth at bedtime.   Yes Historical Provider, MD  warfarin (COUMADIN) 5 MG tablet Take 10-15 mg by mouth See admin instructions. Alternate taking 2 tablets one day then take 3 tablets the next day   Yes Historical Provider, MD  zolpidem (AMBIEN) 10 MG tablet Take 10 mg by mouth at bedtime.   Yes Historical Provider, MD  amoxicillin (AMOXIL) 500 MG capsule Take 1 capsule (500 mg total) by mouth 3 (three) times daily. Patient not taking: Reported on 05/13/2015 04/24/15   Billy Fischer, MD  HYDROcodone-acetaminophen (NORCO/VICODIN) 5-325 MG tablet Take 1 tablet by mouth every 6 (six) hours as needed. For dental pain Patient not taking: Reported on 05/13/2015 04/24/15   Billy Fischer, MD    Inpatient Medications:  . albuterol  2.5 mg Nebulization Q6H  . amLODipine  5 mg Oral Daily  . metoprolol  succinate  25 mg Oral Daily  . QUEtiapine  100 mg Oral QHS  . Warfarin - Pharmacist Dosing Inpatient   Does not apply q1800  . zolpidem  5 mg Oral QHS   . sodium chloride 75 mL/hr at 05/13/15 9147    Allergies:  Allergies  Allergen Reactions  . Nsaids Other (See Comments)    "not suppose to take due to being on blood thinners"  . Topamax [Topiramate] Other (See Comments)    "makes face numb"     Social History   Social History  . Marital Status: Married    Spouse Name: N/A  . Number of Children: N/A  . Years of Education: N/A   Occupational History  . Not on file.   Social History Main Topics    . Smoking status: Former Research scientist (life sciences)  . Smokeless tobacco: Not on file     Comment: Smoked for 11 years, quit ~2012  . Alcohol Use: No  . Drug Use: No  . Sexual Activity: Not on file   Other Topics Concern  . Not on file   Social History Narrative     Family History  Problem Relation Age of Onset  . Hypertension Father      Review of Systems:No syncope. All other systems reviewed and are otherwise negative except as noted above.  Labs:  Recent Labs  05/13/15 0630  TROPONINI <0.03   Lab Results  Component Value Date   WBC 8.4 05/12/2015   HGB 11.5* 05/12/2015   HCT 35.8* 05/12/2015   MCV 89.7 05/12/2015   PLT 282 05/12/2015    Recent Labs Lab 05/12/15 2232  NA 139  K 4.0  CL 107  CO2 27  BUN 9  CREATININE 0.95  CALCIUM 9.2  GLUCOSE 101*   Lab Results  Component Value Date   CHOL 180 05/13/2015   HDL 32* 05/13/2015   LDLCALC 133* 05/13/2015   TRIG 77 05/13/2015   Radiology/Studies:  Dg Chest 2 View  05/12/2015  CLINICAL DATA:  34 year old female with chest pain and shortness of breath EXAM: CHEST  2 VIEW COMPARISON:  Radiograph dated 10/14/2008 FINDINGS: The heart size and mediastinal contours are within normal limits. Both lungs are clear. The visualized skeletal structures are unremarkable. IMPRESSION: No active cardiopulmonary disease. Electronically Signed   By: Anner Crete M.D.   On: 05/12/2015 22:58   Ct Angio Chest Pe W/cm &/or Wo Cm  05/13/2015  CLINICAL DATA:  34 year old female with intermittent chest pain and shortness of breath EXAM: CT ANGIOGRAPHY CHEST WITH CONTRAST TECHNIQUE: Multidetector CT imaging of the chest was performed using the standard protocol during bolus administration of intravenous contrast. Multiplanar CT image reconstructions and MIPs were obtained to evaluate the vascular anatomy. CONTRAST:  146m OMNIPAQUE IOHEXOL 350 MG/ML SOLN COMPARISON:  Chest radiograph dated 05/12/2015 FINDINGS: The lungs are clear. No pleural  effusion. The central airways are patent. The thoracic aorta appears unremarkable. Evaluation of the pulmonary arteries is limited due to suboptimal opacification of the peripheral branches. No central pulmonary artery embolus identified. There is prominence of the main pulmonary trunk concerning for a degree of pulmonary hypertension. Top-normal cardiac size. No pericardial effusion. The visualized esophagus and thyroid gland appear unremarkable. There is no axillary adenopathy. The chest wall soft tissues appear unremarkable. The osseous structures are intact. The visualized upper abdomen appear unremarkable. Review of the MIP images confirms the above findings. IMPRESSION: Suboptimal opacification of the peripheral pulmonary arteries. No CT evidence of central pulmonary artery  embolus. Electronically Signed   By: Anner Crete M.D.   On: 05/13/2015 01:55    Wt Readings from Last 3 Encounters:  05/13/15 291 lb 6.4 oz (132.178 kg)   EKG: NSR 89bpm, no acute ST-T changes  Physical Exam: Blood pressure 125/83, pulse 75, temperature 98.3 F (36.8 C), temperature source Oral, resp. rate 20, height 5' 4"  (1.626 m), weight 291 lb 6.4 oz (132.178 kg), SpO2 99 %. Body mass index is 49.99 kg/(m^2). General: Well developed morbidly obese AAF in no acute distress. Head: Normocephalic, atraumatic, sclera non-icteric, no xanthomas, nares are without discharge.  Neck: Negative for carotid bruits. JVD not elevated. Lungs: Clear bilaterally to auscultation without wheezes, rales, or rhonchi. Breathing is unlabored. Heart: RRR with S1 S2. No murmurs, rubs, or gallops appreciated. Chest wall diffusely TTP. Abdomen: Soft, non-tender, non-distended with normoactive bowel sounds. No hepatomegaly. No rebound/guarding. No obvious abdominal masses. Msk:  Strength and tone appear normal for age. Extremities: No clubbing or cyanosis. No edema.  Distal pedal pulses are 2+ and equal bilaterally. Neuro: Alert and oriented  X 3. No facial asymmetry. No focal deficit. Moves all extremities spontaneously. Psych:  Responds to questions appropriately with a normal affect.    Assessment and Plan:   1. Chest pain - troponin neg x 2 thus far, CXR/CTA nonacute. She was scheduled for stress test in [facility] tomorrow. She has had both atypical and typical features. Also note associated palpitations this AM with NSR on telemetry.  2. Lupus/protein S deficiency with history of PE - CTA neg for central PE. On anticoag, followed by hematology.  3. Essential HTN - controlled on present regimen.  4. Elevated LDL - may consider statin initiation for risk reduction.  Signed, Charlie Pitter PA-C 05/13/2015, 8:12 AM Pager: [phone]

## 2015-05-13 NOTE — Plan of Care (Signed)
Problem: Safety: Goal: Ability to remain free from injury will improve Outcome: Completed/Met Date Met:  05/13/15 Pt educated on safety measures put into place. Pt verbalized understanding.

## 2015-05-13 NOTE — Progress Notes (Signed)
Day 1 of 2 day lexiscan stress test completed without significant complication. Unfortunately patient states she cannot walk as she tried treadmill in the past. Stress portion done today, pending resting portion tomorrow, [facility].  Ramond DialSigned, Shann Merrick PA Pager: [phone]

## 2015-05-13 NOTE — ED Notes (Signed)
Offered nitro but patient declined. Prepared to go to CT.

## 2015-05-13 NOTE — ED Notes (Signed)
Admitting MD at the bedside.  

## 2015-05-13 NOTE — Progress Notes (Addendum)
PATIENT DETAILS Name: Shelley Bowman Age: 34 y.o. Sex: female Date of Birth: 1980-10-31 Admit Date: 05/12/2015 Admitting Physician Clydie Braun, MD PCP:Pcp Not In System  Subjective: Chest pain is reproducible. Left hand-multiple joints are tender but not swollen  Assessment/Plan: Chest pain:suspect atypical-troponin negative. Seen by cards-for 2 days Nuc stress test. Suspect-could be from arthritis-from underlying Lupus-will start prednisone taper-unable to use NSAID's given coumadin.  Hx of Lupus:has outpatient follow up with Rheum-non compliant with Plaquenil-restart-start steroid taper for athralgias.  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 On anticoag with coumadin (per patient target INR 2-3), followed by hematology.Coumadin being managed per pharmacy  Hypertension: Stable-continue metoprolol and amlodipine  Morbid obesity:counseled regarding importance of weight loss  Disposition: Remain inpatient-home when Nuc stress test neg  Antimicrobial agents  See below  Anti-infectives    Start     Dose/Rate Route Frequency Ordered Stop   05/13/15 1145  hydroxychloroquine (PLAQUENIL) tablet 200 mg     200 mg Oral Daily 05/13/15 1135        DVT Prophylaxis: Coumadin  Code Status: Full code   Family Communication None at bedside  Procedures: None  CONSULTS:  cardiology  Time spent 25 minutes-Greater than 50% of this time was spent in counseling, explanation of diagnosis, planning of further management, and coordination of care.  MEDICATIONS: Scheduled Meds: . amLODipine  5 mg Oral Daily  . hydroxychloroquine  200 mg Oral Daily  . metoprolol succinate  25 mg Oral Daily  . [START ON 05/14/2015] predniSONE  40 mg Oral Q breakfast  . QUEtiapine  100 mg Oral QHS  . regadenoson      . regadenoson  0.4 mg Intravenous Once  . Warfarin - Pharmacist Dosing Inpatient   Does not apply q1800  . zolpidem  5 mg Oral QHS    Continuous Infusions: . sodium chloride 75 mL/hr at 05/13/15 0619   PRN Meds:.acetaminophen, albuterol, gi cocktail, morphine injection, nitroGLYCERIN, ondansetron (ZOFRAN) IV    PHYSICAL EXAM: Vital signs in last 24 hours: Filed Vitals:   05/13/15 0430 05/13/15 0445 05/13/15 0527 05/13/15 0739  BP: 123/85 123/76 138/89 125/83  Pulse: 80 81 78 75  Temp:   98.4 F (36.9 C) 98.3 F (36.8 C)  TempSrc:   Oral Oral  Resp: Height:    (1.626 m)   Weight:   132.178 kg (291 lb 6.4 oz)   SpO2: 98% 97%  99%    Weight change:  Filed Weights   05/13/15 0527  Weight: 132.178 kg (291 lb 6.4 oz)   Body mass index is 49.99 kg/(m^2).   Gen Exam: Awake and alert with clear speech. Neck: Supple, No JVD.   Chest: B/L Clear.   CVS: S1 S2 Regular, no murmurs.  Abdomen: soft, BS +, non tender, non distended.  Extremities: no edema, lower extremities warm to touch. Neurologic: Non Focal.   Skin: No Rash.   Wounds: N/A.   Intake/Output from previous day: No intake or output data in the 24 hours ending 05/13/15 1135   LAB RESULTS: CBC  Recent Labs Lab 05/12/15 2232  WBC 8.4  HGB 11.5*  HCT 35.8*  PLT 282  MCV 89.7  MCH 28.8  MCHC 32.1  RDW 15.5    Chemistries   Recent Labs Lab 05/12/15 2232  NA 139  K 4.0  CL 107  CO2 27  GLUCOSE 101*  BUN 9  CREATININE 0.95  CALCIUM 9.2    CBG: No results for input(s): GLUCAP in the last 168 hours.  GFR Estimated Creatinine Clearance: 111.8 mL/min (by C-G formula based on Cr of 0.95).  Coagulation profile  Recent Labs Lab 05/13/15 0054  INR 4.99*    Cardiac Enzymes  Recent Labs Lab 05/13/15 0630 05/13/15 0830  TROPONINI <0.03 <0.03    Invalid input(s): POCBNP No results for input(s): DDIMER in the last 72 hours. No results for input(s): HGBA1C in the last 72 hours.  Recent Labs  05/13/15 0630  CHOL 180  HDL 32*  LDLCALC 133*  TRIG 77  CHOLHDL 5.6   No results for input(s): TSH,  T4TOTAL, T3FREE, THYROIDAB in the last 72 hours.  Invalid input(s): FREET3 No results for input(s): VITAMINB12, FOLATE, FERRITIN, TIBC, IRON, RETICCTPCT in the last 72 hours. No results for input(s): LIPASE, AMYLASE in the last 72 hours.  Urine Studies No results for input(s): UHGB, CRYS in the last 72 hours.  Invalid input(s): UACOL, UAPR, USPG, UPH, UTP, UGL, UKET, UBIL, UNIT, UROB, ULEU, UEPI, UWBC, URBC, UBAC, CAST, UCOM, BILUA  MICROBIOLOGY: Recent Results (from the past 240 hour(s))  MRSA PCR Screening     Status: None   Collection Time: 05/13/15  5:27 AM  Result Value Ref Range Status   MRSA by PCR NEGATIVE NEGATIVE Final    Comment:        The GeneXpert MRSA Assay (FDA approved for NASAL specimens only), is one component of a comprehensive MRSA colonization surveillance program. It is not intended to diagnose MRSA infection nor to guide or monitor treatment for MRSA infections.     RADIOLOGY STUDIES/RESULTS: Dg Chest 2 View  05/12/2015  CLINICAL DATA:  34 year old female with chest pain and shortness of breath EXAM: CHEST  2 VIEW COMPARISON:  Radiograph dated 10/14/2008 FINDINGS: The heart size and mediastinal contours are within normal limits. Both lungs are clear. The visualized skeletal structures are unremarkable. IMPRESSION: No active cardiopulmonary disease. Electronically Signed   By: Arash  Radparvar M.D.   On: 05/12/2015 22:58   Ct Angio Chest Pe W/cm &/or Wo Cm  05/13/2015  CLINICAL DATA:  34 year old female with intermittent chest pain and shortness of breath EXAM: CT ANGIOGRAPHY CHEST WITH CONTRAST TECHNIQUE: Multidetector CT imaging of the chest was performed using the standard protocol during bolus administration of intravenous contrast. Multiplanar CT image reconstructions and MIPs were obtained to evaluate the vascular anatomy. CONTRAST:  <MEASUREMENTPanorama <MEASUREMENLongs DrF78.2[place] [facility] <MEASUREMENLongs DrF78.2[place] mKoreEast[facility]<MEASUREMENLongs DrF78.2[place] mKoreB[facility] <MEASUREMENLongs DrF78.2[place] m[facility]ore[facility] <MEASUREMENLongs DrF78.2[place] mKoreCharl[facility] <MEASUREMENLongs DrF78.2[place] mKoreAdvocate Condell Medical Cent AWallenpaupack [place] <MEASUREMENLongs DrF78.2[place]26 mKorePhoenix [facility] <MEASUREMENLongs DrF78.2[place]36 mKoreNorth [facility] Vac[facility] AA<MEASUREMENLongs DrF78.2[place]6 mKoreWashington Hospit ACrawfo<MEASUREMENLongs DrF78.2[place]45 mKoreLanai Community HospitalAlaska [facility]arts IOHEXOL 350 MG/ML SOLN COMPARISON:  Chest radiograph dated 05/12/2015 FINDINGS: The lungs are clear. No  pleural effusion. The central airways are patent. The thoracic aorta appears unremarkable. Evaluation of the pulmonary arteries is limited due to suboptimal opacification of the peripheral branches. No central pulmonary artery embolus identified. There is prominence of the main pulmonary trunk concerning for a degree of pulmonary hypertension. Top-normal cardiac size. No pericardial effusion. The visualized esophagus and thyroid gland appear unremarkable. There is no axillary adenopathy. The chest wall soft tissues appear unremarkable. The osseous structures are intact. The visualized upper abdomen appear unremarkable. Review of the MIP images confirms the above findings. IMPRESSION: Suboptimal opacification of the peripheral pulmonary arteries. No CT evidence of central pulmonary artery embolus. Electronically Signed   By: Arash  Radparvar M.D.   On: 05/13/2015 01:55    GHIMIRE,SHANKER, MD  [facility] Pager:[phone]  If 7PM-7AM, please contact night-coverage [email or web address] Password [email or web address] 05/13/2015, 11:35 AM

## 2015-05-13 NOTE — Plan of Care (Signed)
Problem: Pain Managment: Goal: General experience of comfort will improve Outcome: Completed/Met Date Met:  05/13/15 Pt educated on pain scale and interventions. Pt verbalized understanding.

## 2015-05-13 NOTE — ED Notes (Signed)
Attempted report to 3E, consulted MD Katrinka BlazingSmith about telemetry vs Step down. Pt being changed to stepdown bed.

## 2015-05-14 ENCOUNTER — Ambulatory Visit (HOSPITAL_COMMUNITY): Payer: Medicare Other | Attending: Physician Assistant

## 2015-05-14 ENCOUNTER — Encounter (HOSPITAL_COMMUNITY): Payer: Medicare Other

## 2015-05-14 DIAGNOSIS — Z7901 Long term (current) use of anticoagulants: Secondary | ICD-10-CM

## 2015-05-14 DIAGNOSIS — R002 Palpitations: Secondary | ICD-10-CM

## 2015-05-14 DIAGNOSIS — I10 Essential (primary) hypertension: Secondary | ICD-10-CM

## 2015-05-14 DIAGNOSIS — R079 Chest pain, unspecified: Secondary | ICD-10-CM

## 2015-05-14 DIAGNOSIS — D6859 Other primary thrombophilia: Secondary | ICD-10-CM

## 2015-05-14 LAB — NM MYOCAR MULTI W/SPECT W/WALL MOTION / EF
CSEPED: 0 min
Estimated workload: 1 METS
Exercise duration (sec): 0 s
MPHR: 185 {beats}/min
Peak HR: 136 {beats}/min
Percent HR: 73 %
Rest HR: 80 {beats}/min

## 2015-05-14 LAB — PROTIME-INR
INR: 2.29 — ABNORMAL HIGH (ref 0.00–1.49)
Prothrombin Time: 25 seconds — ABNORMAL HIGH (ref 11.6–15.2)

## 2015-05-14 MED ORDER — WARFARIN - PHARMACIST DOSING INPATIENT: Freq: Every day | Status: DC

## 2015-05-14 MED ORDER — TECHNETIUM TC 99M SESTAMIBI GENERIC - CARDIOLITE
30.0000 | Freq: Once | INTRAVENOUS | Status: AC | PRN
  Administered 2015-05-14: 30 via INTRAVENOUS

## 2015-05-14 MED ORDER — WARFARIN SODIUM 10 MG PO TABS: 10.0000 mg | ORAL_TABLET | Freq: Once | ORAL | Status: DC

## 2015-05-14 MED ORDER — PREDNISONE 10 MG PO TABS: ORAL_TABLET | ORAL | Status: DC

## 2015-05-14 MED ORDER — HYDROXYCHLOROQUINE SULFATE 200 MG PO TABS: 200.0000 mg | ORAL_TABLET | Freq: Every day | ORAL | Status: AC

## 2015-05-14 MED ORDER — OXYCODONE HCL 5 MG PO TABS: 5.0000 mg | ORAL_TABLET | Freq: Four times a day (QID) | ORAL | Status: DC | PRN

## 2015-05-14 NOTE — Care Management Note (Signed)
Case Management Note  Patient Details  Name: Shelley Bowman MRN: [id number] Date of Birth: 07/15/1980  Subjective/Objective: Pt admitted for chest pain. Post 2 day stress test.                     Action/Plan: No PCP in the system. [place] CM did provide pt with the [facility] Connect Number for patient to call for PCP. No further needs from CM at this time.   Gala LewandowskyGraves-Bigelow, Camika Marsico Kaye, RN 05/14/2015, 1:39 PM

## 2015-05-14 NOTE — Care Management Obs Status (Signed)
MEDICARE OBSERVATION STATUS NOTIFICATION   Patient Details  Name: Shelley Bowman MRN: [id number] Date of Birth: 09/03/1980   Medicare Observation Status Notification Given:  Yes    Gala LewandowskyGraves-Bigelow, Seleny Allbright Kaye, RN 05/14/2015, 1:38 PM

## 2015-05-14 NOTE — Progress Notes (Signed)
ANTICOAGULATION CONSULT NOTE - Initial Consult  Pharmacy Consult for Coumadin Indication: PE 2005, prot S deficiency  Allergies  Allergen Reactions  . Nsaids Other (See Comments)    "not suppose to take due to being on blood thinners"  . Topamax [Topiramate] Other (See Comments)    "makes face numb"     Patient Measurements: Height: 5\' 4"  (162.6 cm) Weight: 291 lb 3.2 oz (132.087 kg) IBW/kg (Calculated) : 54.7  Vital Signs: Temp: 97.7 F (36.5 C) (12/07 0500) Temp Source: Oral (12/07 0500) BP: 104/57 mmHg (12/07 1003) Pulse Rate: 81 (12/07 1003)  Labs:  Recent Labs  05/12/15 2232 05/13/15 0054 05/13/15 0630 05/13/15 0830 05/13/15 1130 05/14/15 0342  HGB 11.5*  --   --   --   --   --   HCT 35.8*  --   --   --   --   --   PLT 282  --   --   --   --   --   LABPROT  --  44.9*  --   --   --  25.0*  INR  --  4.99*  --   --   --  2.29*  CREATININE 0.95  --   --   --   --   --   TROPONINI  --   --  <0.03 <0.03 <0.03  --     Estimated Creatinine Clearance: 111.8 mL/min (by C-G formula based on Cr of 0.95).   Medical History: Past Medical History  Diagnosis Date  . Pulmonary embolism (HCC) 2005  . Hypertension   . Lupus (systemic lupus erythematosus) (HCC)   . Protein S deficiency (HCC)     a. goal INR 3-4 per patient.  . Morbid obesity (HCC)     Assessment: 34 y.o. F presents with CP. Pt on coumadin PTA for h/o PE 2005, prot s deficiency. Home dose: 10mg  alternating with 15mg  - took 15mg  on 12/4. INR on admit 4.99, now therapeutic this AM at 2.29. Noted that pt goal INR 3-4 (she reports this is her goal per her MD). CT chest neg for PE. CBC stable on admission. No bleed documented.  Goal of Therapy:  INR 3-4 Monitor platelets by anticoagulation protocol: Yes   Plan:  Warfarin 10mg  x 1 dose tonight Daily INR Mon s/sx bleeding  Babs BertinHaley Angell Honse, PharmD, [facility] IncBCPS Clinical Pharmacist Pager [phone] 05/14/2015 12:39 PM

## 2015-05-14 NOTE — Progress Notes (Signed)
Patient complaining of 7/10 headache unrelieved by Tylenol or Morphine. Notified K. Schorr and awaiting any new orders.

## 2015-05-14 NOTE — Discharge Instructions (Signed)
Follow with Primary MD  for INR check and post hospital follow-up in the next 2-3 days.   Please get a complete blood count and chemistry panel checked by your Primary MD at your next visit, and again as instructed by your Primary MD.  Get Medicines reviewed and adjusted. Please take all your medications with you for your next visit with your Primary MD  Please request your Primary MD to go over all hospital tests and procedure/radiological results at the follow up, please ask your Primary MD to get all Hospital records sent to his/her office.  If you experience worsening of your admission symptoms, develop shortness of breath, life threatening emergency, suicidal or homicidal thoughts you must seek medical attention immediately by calling 911 or calling your MD immediately  if symptoms less severe.  You must read complete instructions/literature along with all the possible adverse reactions/side effects for all the Medicines you take and that have been prescribed to you. Take any new Medicines after you have completely understood and accpet all the possible adverse reactions/side effects.   Do not drive when taking Pain medications or sleeping medications (Benzodaizepines)  Do not take more than prescribed Pain, Sleep and Anxiety Medications  Special Instructions: If you have smoked or chewed Tobacco  in the last 2 yrs please stop smoking, stop any regular Alcohol  and or any Recreational drug use.  Wear Seat belts while driving.  Please note  You were cared for by a hospitalist during your hospital stay. Once you are discharged, your primary care physician will handle any further medical issues. Please note that NO REFILLS for any discharge medications will be authorized once you are discharged, as it is imperative that you return to your primary care physician (or establish a relationship with a primary care physician if you do not have one) for your aftercare needs so that they can  reassess your need for medications and monitor your lab values.

## 2015-05-14 NOTE — Progress Notes (Signed)
   PATIENT ID:  34 y/o F with history of PE in 2005 with subsequent diagnoses of lupus and protein S deficiency, obesity, HTN, DM (although not on home meds and glucose OK), morbid obesity and former tobacco abuse (11 years) who [facility].  INTERVAL HISTORY: Went for day 1 of Lexiscan stress test yesterday.  SUBJECTIVE:  She continues to have chest pounding and tightness. She denies lower extremity edema now but has had some at home that is responsive to Lasix.   PHYSICAL EXAM Filed Vitals:   05/13/15 1406 05/13/15 1639 05/13/15 2035 05/14/15 0500  BP: 144/96 121/67 142/85 97/54  Pulse: 77 65 72 70  Temp: 98.4 F (36.9 C) 98.4 F (36.9 C) 98.6 F (37 C) 97.7 F (36.5 C)  TempSrc: Oral Oral Oral Oral  Resp:   18 18  Height:      Weight:    132.087 kg (291 lb 3.2 oz)  SpO2: 96% 97% 99% 98%   General:  Well-appearing morbidly obese woman laying in bed in no acute distress Neck: No JVD Lungs:  Clear to auscultation bilaterally. No crackles, rhonchi, or wheezes. Heart:  Regular rate and rhythm. No murmurs, rubs, or gallops. Normal S1/S2. Abdomen:  Soft, nontender, nondistended.  + abdominal sounds Extremities:  Warm and well-perfused. No edema  LABS: Lab Results  Component Value Date   TROPONINI <0.03 05/13/2015   Results for orders placed or performed during the hospital encounter of 05/12/15 (from the past 24 hour(s))  Troponin I-serum (0, 3, 6 hours)     Status: None   Collection Time: 05/13/15 11:30 AM  Result Value Ref Range   Troponin I <0.03 <0.031 ng/mL  Protime-INR     Status: Abnormal   Collection Time: 05/14/15  3:42 AM  Result Value Ref Range   Prothrombin Time 25.0 (H) 11.6 - 15.2 seconds   INR 2.29 (H) 0.00 - 1.49    Intake/Output Summary (Last 24 hours) at 05/14/15 0856 Last data filed at 05/14/15 0648  Gross per 24 hour  Intake   2485 ml  Output    750 ml  Net   1735 ml    Telemetry: Sinus rhythm. No  events  ASSESSMENT AND PLAN:  1. Chest pain - Troponin negative x3.  CXR/CTA nonacute. She continues to note palpitations though telemetry has been unremarkable. She will go for day 2 of her stress test today.  2. Lupus/protein S deficiency with history of PE - CTA neg for central PE. On anticoag, followed by hematology.  Goal INR 3-4.  CRP unremarkable.  3. Essential HTN - blood pressure well controlled on amlodipine and hypertension.  4. CV Disease prevention: Unable to assess 10 year risk due to her age.  Consider starting statin, though it is not strictly indicated until age 35.   Active Problems:   Chest pain   Acute chest pain   HTN (hypertension)   Lupus (systemic lupus erythematosus) (HCC)   Protein S deficiency (HCC)   Chronic anticoagulation   Morbid obesity (HCC)    Savan Ruta C. Duke Salviaandolph, MD, [facility]ocatelloFACC 05/14/2015 8:56 AM

## 2015-05-14 NOTE — Discharge Summary (Addendum)
PATIENT DETAILS Name: Shelley Bowman Age: 34 y.o. Sex: female Date of Birth: May 29, 1981 MRN: [id number]. Admitting Physician: Clydie Braun, MD PCP:Pcp Not In System  Admit Date: 05/12/2015 Discharge date: 05/14/2015  Recommendations for Outpatient Follow-up:  1. Ensure follow-up with rheumatology  2. Please repeat CBC/BMET at next visit 3. Please check INR at next visit  PRIMARY DISCHARGE DIAGNOSIS:  Active Problems:   Chest pain   Acute chest pain   HTN (hypertension)   Lupus (systemic lupus erythematosus) (HCC)   Protein S deficiency (HCC)   Chronic anticoagulation   Morbid obesity (HCC)      PAST MEDICAL HISTORY: Past Medical History  Diagnosis Date  . Pulmonary embolism (HCC) 2005  . Hypertension   . Lupus (systemic lupus erythematosus) (HCC)   . Protein S deficiency (HCC)     a. goal INR 3-4 per patient.  . Morbid obesity (HCC)     DISCHARGE MEDICATIONS: Current Discharge Medication List    START taking these medications   Details  hydroxychloroquine (PLAQUENIL) 200 MG tablet Take 1 tablet (200 mg total) by mouth daily. Qty: 30 tablet, Refills: 0    oxyCODONE (ROXICODONE) 5 MG immediate release tablet Take 1 tablet (5 mg total) by mouth every 6 (six) hours as needed for severe pain. Qty: 20 tablet, Refills: 0    predniSONE (DELTASONE) 10 MG tablet Take 4 tablets (40 mg) daily for 2 days, then, Take 3 tablets (30 mg) daily for 2 days, then, Take 2 tablets (20 mg) daily for 2 days, then, Take 1 tablets (10 mg) daily for 1 days, then stop Qty: 19 tablet, Refills: 0      CONTINUE these medications which have NOT CHANGED   Details  amLODipine (NORVASC) 5 MG tablet Take 5 mg by mouth daily.    metoprolol succinate (TOPROL-XL) 25 MG 24 hr tablet Take 25 mg by mouth daily.    QUEtiapine (SEROQUEL) 100 MG tablet Take 100 mg by mouth at bedtime.    warfarin (COUMADIN) 5 MG tablet Take 10-15 mg by mouth See admin instructions. Alternate taking 2  tablets one day then take 3 tablets the next day    zolpidem (AMBIEN) 10 MG tablet Take 10 mg by mouth at bedtime.      STOP taking these medications     amoxicillin (AMOXIL) 500 MG capsule      HYDROcodone-acetaminophen (NORCO/VICODIN) 5-325 MG tablet         ALLERGIES:   Allergies  Allergen Reactions  . Nsaids Other (See Comments)    "not suppose to take due to being on blood thinners"  . Topamax [Topiramate] Other (See Comments)    "makes face numb"     BRIEF HPI:  See H&P, Labs, Consult and Test reports for all details in brief, patient was admitted for evaluation of chest pain.  CONSULTATIONS:   cardiology  PERTINENT RADIOLOGIC STUDIES: Dg Chest 2 View  05/12/2015  CLINICAL DATA:  34 year old female with chest pain and shortness of breath EXAM: CHEST  2 VIEW COMPARISON:  Radiograph dated 10/14/2008 FINDINGS: The heart size and mediastinal contours are within normal limits. Both lungs are clear. The visualized skeletal structures are unremarkable. IMPRESSION: No active cardiopulmonary disease. Electronically Signed   By: Elgie Collard M.D.   On: 05/12/2015 22:58   Ct Angio Chest Pe W/cm &/or Wo Cm  05/13/2015  CLINICAL DATA:  34 year old female with intermittent chest pain and shortness of breath EXAM: CT ANGIOGRAPHY CHEST WITH CONTRAST TECHNIQUE: Multidetector  CT imaging of the chest was performed using the standard protocol during bolus administration of intravenous contrast. Multiplanar CT image reconstructions and MIPs were obtained to evaluate the vascular anatomy. CONTRAST:  OMNIPAQUE IOHEXOL 350 MG/ML SOLN COMPARISON:  Chest radiograph dated 05/12/2015 FINDINGS: The lungs are clear. No pleural effusion. The central airways are patent. The thoracic aorta appears unremarkable. Evaluation of the pulmonary arteries is limited due to suboptimal opacification of the peripheral branches. No central pulmonary artery embolus identified. There is prominence of the main  pulmonary trunk concerning for a degree of pulmonary hypertension. Top-normal cardiac size. No pericardial effusion. The visualized esophagus and thyroid gland appear unremarkable. There is no axillary adenopathy. The chest wall soft tissues appear unremarkable. The osseous structures are intact. The visualized upper abdomen appear unremarkable. Review of the MIP images confirms the above findings. IMPRESSION: Suboptimal opacification of the peripheral pulmonary arteries. No CT evidence of central pulmonary artery embolus. Electronically Signed   By: Elgie Collard M.D.   On: 05/13/2015 01:55   Nm Myocar Multi W/spect W/wall Motion / Ef  05/14/2015  CLINICAL DATA:  34 year old with atypical chest pain. History of hypertension. Former smoker. EXAM: MYOCARDIAL IMAGING WITH SPECT (REST AND PHARMACOLOGIC-STRESS - 2 DAY PROTOCOL) GATED LEFT VENTRICULAR WALL MOTION STUDY LEFT VENTRICULAR EJECTION FRACTION TECHNIQUE: Standard myocardial SPECT imaging was performed after resting intravenous injection of 30 mCi Tc-45m sestamibi. Subsequently, on a second day, intravenous infusion of Lexiscan was performed under the supervision of the Cardiology staff. At peak effect of the drug, 30 mCi Tc-47m sestamibi was injected intravenously and standard myocardial SPECT imaging was performed. Quantitative gated imaging was also performed to evaluate left ventricular wall motion, and estimate left ventricular ejection fraction. COMPARISON:  CT 05/13/2015. FINDINGS: Perfusion: No decreased activity in the left ventricle on stress imaging to suggest reversible ischemia or infarction. Mild breast attenuation of the anterior wall. Wall Motion: Normal left ventricular wall motion. No left ventricular dilation. Left Ventricular Ejection Fraction: 57 % End diastolic volume 100 ml End systolic volume 43 ml IMPRESSION: 1. No reversible ischemia or infarction. 2. Normal left ventricular wall motion. 3. Left ventricular ejection fraction 57%  4. Low-risk stress test findings*. *2012 Appropriate Use Criteria for Coronary Revascularization Focused Update: J Am Coll Cardiol. 2012;59(9):857-881. http://content.dementiazones.com.aspx?articleid=1201161 Electronically Signed   By: Carey Bullocks M.D.   On: 05/14/2015 08:28     PERTINENT LAB RESULTS: CBC:  Recent Labs  05/12/15 2232  WBC 8.4  HGB 11.5*  HCT 35.8*  PLT 282   CMET CMP     Component Value Date/Time   NA 139 05/12/2015 2232   K 4.0 05/12/2015 2232   CL 107 05/12/2015 2232   CO2 27 05/12/2015 2232   GLUCOSE 101* 05/12/2015 2232   BUN 9 05/12/2015 2232   CREATININE 0.95 05/12/2015 2232   CALCIUM 9.2 05/12/2015 2232   PROT 7.6 10/14/2008 1640   ALBUMIN 3.4* 10/14/2008 1640   AST 17 10/14/2008 1640   ALT 19 10/14/2008 1640   ALKPHOS 71 10/14/2008 1640   BILITOT 0.4 10/14/2008 1640   GFRNONAA >60 05/12/2015 2232   GFRAA >60 05/12/2015 2232    GFR Estimated Creatinine Clearance: 111.8 mL/min (by C-G formula based on Cr of 0.95). No results for input(s): LIPASE, AMYLASE in the last 72 hours.  Recent Labs  05/13/15 0630 05/13/15 0830 05/13/15 1130  TROPONINI <0.03 <0.03 <0.03   Invalid input(s): POCBNP No results for input(s): DDIMER in the last 72 hours. No results for input(s):  HGBA1C in the last 72 hours.  Recent Labs  05/13/15 0630  CHOL 180  HDL 32*  LDLCALC 133*  TRIG 77  CHOLHDL 5.6   No results for input(s): TSH, T4TOTAL, T3FREE, THYROIDAB in the last 72 hours.  Invalid input(s): FREET3 No results for input(s): VITAMINB12, FOLATE, FERRITIN, TIBC, IRON, RETICCTPCT in the last 72 hours. Coags:  Recent Labs  05/13/15 0054 05/14/15 0342  INR 4.99* 2.29*   Microbiology: Recent Results (from the past 240 hour(s))  MRSA PCR Screening     Status: None   Collection Time: 05/13/15  5:27 AM  Result Value Ref Range Status   MRSA by PCR NEGATIVE NEGATIVE Final    Comment:        The GeneXpert MRSA Assay (FDA approved for NASAL  specimens only), is one component of a comprehensive MRSA colonization surveillance program. It is not intended to diagnose MRSA infection nor to guide or monitor treatment for MRSA infections.      BRIEF HOSPITAL COURSE:  Chest pain:suspect atypical-troponin negative. Seen by cards-underwent  2 days Nuc stress test-which was new. No further workup recommended by cardiology. Suspect-could be from arthritis-from underlying Lupus-will start prednisone taper-unable to use NSAID's given coumadin.  Hx of Lupus:has outpatient follow up with Rheum-non compliant with Plaquenil-restart-start steroid taper for athralgias of small joints of her left hand (Note no swelling or erythema noted). Have asked patient to follow-up with her primary rheumatologist for further workup/treatment-if not better over the next few days.  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 On anticoag with coumadin (per patient target INR 3-4), followed by hematology.patient will be discharged on her usual dosing of Coumadin-she was asked to follow-up with primary care M.D. in the next few days for an INR check.  Hypertension: Stable-continue metoprolol and amlodipine  Morbid obesity:counseled regarding importance of weight loss  TODAY-DAY OF DISCHARGE:  Subjective:   Shelley Bowman today has no headache,no chest abdominal pain,no new weakness tingling or numbness, feels much better wants to go home today.   Objective:   Blood pressure 104/57, pulse 81, temperature 97.7 F (36.5 C), temperature source Oral, resp. rate 18, height  (1.626 m), weight 132.087 kg (291 lb 3.2 oz), SpO2 98 %.  Intake/Output Summary (Last 24 hours) at 05/14/15 1447 Last data filed at 05/14/15 0648  Gross per 24 hour  Intake   1945 ml  Output    550 ml  Net   1395 ml   Filed Weights   05/13/15 0527 05/14/15 0500  Weight: 132.178 kg (291 lb 6.4 oz) 132.087 kg (291 lb 3.2 oz)    Exam Awake Alert, Oriented *3, No  new F.N deficits, Normal affect [place].AT,PERRAL Supple Neck,No JVD, No cervical lymphadenopathy appriciated.  Symmetrical Chest wall movement, Good air movement bilaterally, CTAB RRR,No Gallops,Rubs or new Murmurs, No Parasternal Heave +ve B.Sounds, Abd Soft, Non tender, No organomegaly appriciated, No rebound -guarding or rigidity. No Cyanosis, Clubbing or edema, No new Rash or bruise  DISCHARGE CONDITION: Stable  DISPOSITION: Home  DISCHARGE INSTRUCTIONS:    Activity:  As tolerated   Follow with Primary MD  for INR check and post hospital follow-up in the next 2-3 days.  Get Medicines reviewed and adjusted: Please take all your medications with you for your next visit with your Primary MD  Please request your Primary MD to go over all hospital tests and procedure/radiological results at the follow up, please ask your Primary MD to get all Hospital records sent  to his/her office.  If you experience worsening of your admission symptoms, develop shortness of breath, life threatening emergency, suicidal or homicidal thoughts you must seek medical attention immediately by calling 911 or calling your MD immediately  if symptoms less severe.  You must read complete instructions/literature along with all the possible adverse reactions/side effects for all the Medicines you take and that have been prescribed to you. Take any new Medicines after you have completely understood and accpet all the possible adverse reactions/side effects.   Do not drive when taking Pain medications.   Do not take more than prescribed Pain, Sleep and Anxiety Medications  Special Instructions: If you have smoked or chewed Tobacco  in the last 2 yrs please stop smoking, stop any regular Alcohol  and or any Recreational drug use.  Wear Seat belts while driving.  Please note  You were cared for by a hospitalist during your hospital stay. Once you are discharged, your primary care physician will handle any further  medical issues. Please note that NO REFILLS for any discharge medications will be authorized once you are discharged, as it is imperative that you return to your primary care physician (or establish a relationship with a primary care physician if you do not have one) for your aftercare needs so that they can reassess your need for medications and monitor your lab values.   Diet recommendation: Heart Healthy diet  Discharge Instructions    Call MD for:  persistant nausea and vomiting    Complete by:  As directed      Call MD for:  severe uncontrolled pain    Complete by:  As directed      Diet - low sodium heart healthy    Complete by:  As directed      Increase activity slowly    Complete by:  As directed           Follow-up Information    Schedule an appointment as soon as possible for a visit to follow up.   Contact information:   Primary MD for hospital follow up in 2-3 days for INR check.       Total Time spent on discharge equals 25 minutes.  SignedJeoffrey Massed: Lyndsay Talamante 05/14/2015 2:47 PM

## 2015-05-29 ENCOUNTER — Emergency Department (HOSPITAL_BASED_OUTPATIENT_CLINIC_OR_DEPARTMENT_OTHER)
Admission: EM | Admit: 2015-05-29 | Discharge: 2015-05-29 | Disposition: A | Discharge status: Home / Self Care | Payer: Medicare Other | Attending: Emergency Medicine | Admitting: Emergency Medicine

## 2015-05-29 ENCOUNTER — Encounter (HOSPITAL_BASED_OUTPATIENT_CLINIC_OR_DEPARTMENT_OTHER): Payer: Self-pay | Admitting: Emergency Medicine

## 2015-05-29 DIAGNOSIS — Z86711 Personal history of pulmonary embolism: Secondary | ICD-10-CM | POA: Diagnosis not present

## 2015-05-29 DIAGNOSIS — K0889 Other specified disorders of teeth and supporting structures: Secondary | ICD-10-CM | POA: Diagnosis not present

## 2015-05-29 DIAGNOSIS — M329 Systemic lupus erythematosus, unspecified: Secondary | ICD-10-CM | POA: Diagnosis not present

## 2015-05-29 DIAGNOSIS — Z7901 Long term (current) use of anticoagulants: Secondary | ICD-10-CM | POA: Diagnosis not present

## 2015-05-29 DIAGNOSIS — I10 Essential (primary) hypertension: Secondary | ICD-10-CM | POA: Insufficient documentation

## 2015-05-29 DIAGNOSIS — Z87891 Personal history of nicotine dependence: Secondary | ICD-10-CM | POA: Insufficient documentation

## 2015-05-29 DIAGNOSIS — Z862 Personal history of diseases of the blood and blood-forming organs and certain disorders involving the immune mechanism: Secondary | ICD-10-CM | POA: Diagnosis not present

## 2015-05-29 MED ORDER — CLINDAMYCIN HCL 150 MG PO CAPS
300.0000 mg | ORAL_CAPSULE | Freq: Once | ORAL | Status: AC
  Administered 2015-05-29: 300 mg via ORAL
  Filled 2015-05-29: qty 2

## 2015-05-29 MED ORDER — HYDROCODONE-ACETAMINOPHEN 5-325 MG PO TABS: 1.0000 | ORAL_TABLET | Freq: Four times a day (QID) | ORAL | Status: DC | PRN

## 2015-05-29 MED ORDER — HYDROCODONE-ACETAMINOPHEN 5-325 MG PO TABS
1.0000 | ORAL_TABLET | Freq: Once | ORAL | Status: AC
  Administered 2015-05-29: 1 via ORAL
  Filled 2015-05-29: qty 1

## 2015-05-29 MED ORDER — CLINDAMYCIN HCL 150 MG PO CAPS: 150.0000 mg | ORAL_CAPSULE | Freq: Three times a day (TID) | ORAL | Status: DC

## 2015-05-29 NOTE — ED Notes (Signed)
Patient states that she is having pain to the top and bottom of her left jaw

## 2015-05-29 NOTE — ED Provider Notes (Addendum)
CSN: [id number]     Arrival date & time 05/29/15  0008 History   First MD Initiated Contact with Patient 05/29/15 0016     Chief Complaint  Patient presents with  . Dental Pain     (Consider location/radiation/quality/duration/timing/severity/associated sxs/prior Treatment) HPI  This is a 34 year old female with a history of protein S deficiency on Coumadin. She has had dental pain for over a month. It started in her left lower first premolar and she has subsequently developed pain in her left upper premolars and molars which worsened yesterday. She was seen on November 17 and treated with amoxicillin and hydrocodone. She stopped taking the amoxicillin because it made her itch.  She was recently hospitalized for chest pain and states that she was unable to have any dental work done due to her INR being elevated. She also states that she is new to the area and does not have a dentist.  She rates her pain as an 8 out of 10, worse with eating or drinking.  Past Medical History  Diagnosis Date  . Pulmonary embolism (HCC) 2005  . Hypertension   . Lupus (systemic lupus erythematosus) (HCC)   . Protein S deficiency (HCC)     a. goal INR 3-4 per patient.  . Morbid obesity [facility])    Past Surgical History  Procedure Laterality Date  . Btl    . Abdominal hysterectomy    . Cholecystectomy    . Tonsillectomy    . Hand surgery     Family History  Problem Relation Age of Onset  . Hypertension Father    Social History  Substance Use Topics  . Smoking status: Former Games developer  . Smokeless tobacco: None     Comment: Smoked for 11 years, quit ~2012  . Alcohol Use: No   OB History    No data available     Review of Systems  All other systems reviewed and are negative.   Allergies  Nsaids and Topamax  Home Medications   Prior to Admission medications   Medication Sig Start Date End Date Taking? Authorizing Provider  amLODipine (NORVASC) 5 MG tablet Take 5 mg by mouth daily.     Historical Provider, MD  hydroxychloroquine (PLAQUENIL) 200 MG tablet Take 1 tablet (200 mg total) by mouth daily. 05/14/15   Shanker Levora Dredge, MD  metoprolol succinate (TOPROL-XL) 25 MG 24 hr tablet Take 25 mg by mouth daily.    Historical Provider, MD  oxyCODONE (ROXICODONE) 5 MG immediate release tablet Take 1 tablet (5 mg total) by mouth every 6 (six) hours as needed for severe pain. 05/14/15   Shanker Levora Dredge, MD  predniSONE (DELTASONE) 10 MG tablet Take 4 tablets (40 mg) daily for 2 days, then, Take 3 tablets (30 mg) daily for 2 days, then, Take 2 tablets (20 mg) daily for 2 days, then, Take 1 tablets (10 mg) daily for 1 days, then stop 05/14/15   Maretta Bees, MD  QUEtiapine (SEROQUEL) 100 MG tablet Take 100 mg by mouth at bedtime.    Historical Provider, MD  warfarin (COUMADIN) 5 MG tablet Take 10-15 mg by mouth See admin instructions. Alternate taking 2 tablets one day then take 3 tablets the next day    Historical Provider, MD  zolpidem (AMBIEN) 10 MG tablet Take 10 mg by mouth at bedtime.    Historical Provider, MD   BP 152/106 mmHg  Pulse 96  Temp(Src) 97.8 F (36.6 C) (Oral)  Resp 18  Ht 5'  4" (1.626 m)  Wt 291 lb (131.997 kg)  BMI 49.93 kg/m2  SpO2 98%   Physical Exam  General: Well-developed, obese female in no acute distress; appearance consistent with age of record HENT: normocephalic; atraumatic; left lower first premolar tender to percussion; left upper premolars and molars tender to percussion Eyes: pupils equal, round and reactive to light; extraocular muscles intact Neck: supple; no lymphadenopathy Heart: regular rate and rhythm Lungs: clear to auscultation bilaterally Abdomen: soft; nondistended Extremities: No deformity; full range of motion Neurologic: Awake, alert and oriented; motor function intact in all extremities and symmetric; no facial droop Skin: Warm and dry Psychiatric: Normal mood and affect    ED Course  Procedures (including critical  care time)   MDM  We'll switch to clindamycin and refer to dentist on call.     Paula LibraJohn Addy Mcmannis, MD 05/29/15 [id number]  Paula LibraJohn Vidya Bamford, MD 05/29/15 [id number]

## 2015-05-29 NOTE — ED Notes (Signed)
C/o left side tooth pain off and on x 1 month  Was seen here for same about 1 month ago

## 2015-06-19 DIAGNOSIS — M545 Low back pain: Secondary | ICD-10-CM | POA: Diagnosis not present

## 2015-06-19 DIAGNOSIS — M542 Cervicalgia: Secondary | ICD-10-CM | POA: Insufficient documentation

## 2015-06-19 DIAGNOSIS — M79605 Pain in left leg: Secondary | ICD-10-CM | POA: Diagnosis not present

## 2015-06-19 DIAGNOSIS — I10 Essential (primary) hypertension: Secondary | ICD-10-CM | POA: Insufficient documentation

## 2015-06-20 ENCOUNTER — Encounter (HOSPITAL_COMMUNITY): Payer: Self-pay | Admitting: Emergency Medicine

## 2015-06-20 ENCOUNTER — Emergency Department (HOSPITAL_COMMUNITY)
Admission: EM | Admit: 2015-06-20 | Discharge: 2015-06-20 | Discharge status: Against Medical Advice | Payer: Medicare Other | Attending: Emergency Medicine | Admitting: Emergency Medicine

## 2015-06-20 NOTE — ED Notes (Signed)
Patient presents for posterior neck pain, lower back pain, left leg pain x1 day. Denies numbness, tingling, denies urinary symptoms. Rates pain 8/10.

## 2015-06-20 NOTE — ED Notes (Signed)
Pt advises registration that she is leaving and no longer wants to wait to be seen; pt left AMA after triage

## 2015-06-21 ENCOUNTER — Encounter (HOSPITAL_COMMUNITY): Payer: Self-pay

## 2015-06-21 ENCOUNTER — Emergency Department (HOSPITAL_COMMUNITY)
Admission: EM | Admit: 2015-06-21 | Discharge: 2015-06-21 | Disposition: A | Discharge status: Home / Self Care | Payer: Medicare Other | Attending: Emergency Medicine | Admitting: Emergency Medicine

## 2015-06-21 DIAGNOSIS — Z862 Personal history of diseases of the blood and blood-forming organs and certain disorders involving the immune mechanism: Secondary | ICD-10-CM | POA: Diagnosis not present

## 2015-06-21 DIAGNOSIS — S4992XA Unspecified injury of left shoulder and upper arm, initial encounter: Secondary | ICD-10-CM | POA: Diagnosis not present

## 2015-06-21 DIAGNOSIS — Y9289 Other specified places as the place of occurrence of the external cause: Secondary | ICD-10-CM | POA: Insufficient documentation

## 2015-06-21 DIAGNOSIS — G8929 Other chronic pain: Secondary | ICD-10-CM | POA: Insufficient documentation

## 2015-06-21 DIAGNOSIS — M549 Dorsalgia, unspecified: Secondary | ICD-10-CM

## 2015-06-21 DIAGNOSIS — T148 Other injury of unspecified body region: Secondary | ICD-10-CM | POA: Insufficient documentation

## 2015-06-21 DIAGNOSIS — Y9389 Activity, other specified: Secondary | ICD-10-CM | POA: Diagnosis not present

## 2015-06-21 DIAGNOSIS — Z79899 Other long term (current) drug therapy: Secondary | ICD-10-CM | POA: Diagnosis not present

## 2015-06-21 DIAGNOSIS — I10 Essential (primary) hypertension: Secondary | ICD-10-CM | POA: Insufficient documentation

## 2015-06-21 DIAGNOSIS — S3992XA Unspecified injury of lower back, initial encounter: Secondary | ICD-10-CM | POA: Diagnosis present

## 2015-06-21 DIAGNOSIS — Z7901 Long term (current) use of anticoagulants: Secondary | ICD-10-CM | POA: Insufficient documentation

## 2015-06-21 DIAGNOSIS — S29001A Unspecified injury of muscle and tendon of front wall of thorax, initial encounter: Secondary | ICD-10-CM | POA: Insufficient documentation

## 2015-06-21 DIAGNOSIS — S199XXA Unspecified injury of neck, initial encounter: Secondary | ICD-10-CM | POA: Insufficient documentation

## 2015-06-21 DIAGNOSIS — Z8739 Personal history of other diseases of the musculoskeletal system and connective tissue: Secondary | ICD-10-CM | POA: Diagnosis not present

## 2015-06-21 DIAGNOSIS — M542 Cervicalgia: Secondary | ICD-10-CM

## 2015-06-21 DIAGNOSIS — Z86711 Personal history of pulmonary embolism: Secondary | ICD-10-CM | POA: Diagnosis not present

## 2015-06-21 DIAGNOSIS — W01198A Fall on same level from slipping, tripping and stumbling with subsequent striking against other object, initial encounter: Secondary | ICD-10-CM | POA: Diagnosis not present

## 2015-06-21 DIAGNOSIS — T148XXA Other injury of unspecified body region, initial encounter: Secondary | ICD-10-CM

## 2015-06-21 DIAGNOSIS — Z87891 Personal history of nicotine dependence: Secondary | ICD-10-CM | POA: Diagnosis not present

## 2015-06-21 DIAGNOSIS — S4991XA Unspecified injury of right shoulder and upper arm, initial encounter: Secondary | ICD-10-CM | POA: Diagnosis not present

## 2015-06-21 DIAGNOSIS — Y998 Other external cause status: Secondary | ICD-10-CM | POA: Insufficient documentation

## 2015-06-21 MED ORDER — METHOCARBAMOL 500 MG PO TABS: 500.0000 mg | ORAL_TABLET | Freq: Four times a day (QID) | ORAL | Status: DC | PRN

## 2015-06-21 NOTE — ED Notes (Signed)
She c/o lower neck and low back area non-traumatic pain x 4 days.  She cites much heavy lifting recently.

## 2015-06-21 NOTE — ED Provider Notes (Signed)
CSN: [id number]     Arrival date & time 06/21/15  1347 History  By signing my name below, I, Shelley Bowman, attest that this documentation has been prepared under the direction and in the presence of Alveta HeimlichStevi Barrett, PA-C Electronically Signed: Soijett Bowman, ED Scribe. 06/21/2015. 4:19 PM.  Chief Complaint  Patient presents with  . Neck Pain  . Back Pain      The history is provided by the patient. No language interpreter was used.    HPI Comments: Shelley Bowman is a 35 y.o. female with a medical hx of DDD, pro who presents to the Emergency Department complaining of constant radiating neck pain onset 4 days ago. She reports that her neck pain radiates to her bilateral shoulders and left hand/arm and her pain is worsened with movement. She notes that she currently in the [place] and [place] furniture exacerbated her pain. Pt notes that she fell and struck her left shoulder against a wall while attempting to sit in a chair and the front leg of the chair gave way - this occurred after her pain began. She states that the fall occurred at the same time as her moving furniture. She secondarily complains of low back pain that radiates to the level of her left knee with associated numbness anteriorly. Pt has been seen for these symptoms on the past and was dx via MRI by orthopedist in [place] and Rx percocet that alleviated her symptoms in 2013.  She states that she has tried extra strength tylenol with no relief for her symptoms. Pt denies bowel/bladder incontinence, fever, CP, SOB, abdominal pain, n/v/d/c, hematuria, dysuria, weakness, leg swelling, and any other symptoms. She reports that she can not take ibuprofen, NSAIDs or topomax due to her coumadin use for her protein S deficiency.    Per pt chart review: Pt came to the ED on 06/20/2015 to be seen for neck pain, back pain, and left leg pain x 1 day but she left without being seen after triage.    Past  Medical History  Diagnosis Date  . Pulmonary embolism (HCC) 2005  . Hypertension   . Lupus (systemic lupus erythematosus) (HCC)   . Protein S deficiency (HCC)     a. goal INR 3-4 per patient.  . Morbid obesity [facility](HCC)    Past Surgical History  Procedure Laterality Date  . Btl    . Abdominal hysterectomy    . Cholecystectomy    . Tonsillectomy    . Hand surgery     Family History  Problem Relation Age of Onset  . Hypertension Father    Social History  Substance Use Topics  . Smoking status: Former Games developermoker  . Smokeless tobacco: None     Comment: Smoked for 11 years, quit ~2012  . Alcohol Use: No   OB History    No data available     Review of Systems  Constitutional: Negative for fever.  Respiratory: Negative for shortness of breath.   Cardiovascular: Negative for chest pain and leg swelling.  Gastrointestinal: Negative for nausea, vomiting and abdominal pain.       No bowel incontinence  Genitourinary: Negative for dysuria, hematuria and difficulty urinating.       No bladder incontinence  Musculoskeletal: Positive for back pain and neck pain. Negative for joint swelling and gait problem.  Skin: Negative for color change, rash and wound.  Neurological: Positive for numbness. Negative for weakness.  Tingling to left hand/arm and left thigh      Allergies  Ibuprofen; Nsaids; and Topamax  Home Medications   Prior to Admission medications   Medication Sig Start Date End Date Taking? Authorizing Provider  amLODipine (NORVASC) 5 MG tablet Take 5 mg by mouth daily.   Yes Historical Provider, MD  furosemide (LASIX) 20 MG tablet Take 20 mg by mouth daily as needed. Fluid 10/01/12  Yes Historical Provider, MD  hydroxychloroquine (PLAQUENIL) 200 MG tablet Take 1 tablet (200 mg total) by mouth daily. 05/14/15  Yes Shanker Levora Dredge, MD  metoprolol succinate (TOPROL-XL) 25 MG 24 hr tablet Take 25 mg by mouth daily.   Yes Historical Provider, MD  QUEtiapine (SEROQUEL) 100  MG tablet Take 100 mg by mouth at bedtime.   Yes Historical Provider, MD  warfarin (COUMADIN) 5 MG tablet Take 10-15 mg by mouth See admin instructions. Alternate taking 2 tablets one day then take 3 tablets the next day   Yes Historical Provider, MD  zolpidem (AMBIEN) 10 MG tablet Take 10 mg by mouth at bedtime as needed for sleep.    Yes Historical Provider, MD  methocarbamol (ROBAXIN) 500 MG tablet Take 1-2 tablets (500-1,000 mg total) by mouth every 6 (six) hours as needed for muscle spasms (and pain). 06/21/15   Trixie Dredge, PA-C   BP 156/96 mmHg  Pulse 97  Temp(Src) 98.6 F (37 C) (Oral)  Resp 18  SpO2 100% Physical Exam  Constitutional: She appears well-developed and well-nourished. No distress.  HENT:  Head: Normocephalic and atraumatic.  Neck: Neck supple.  Pulmonary/Chest: Effort normal.  Abdominal: Soft. She exhibits no distension and no mass. There is no tenderness. There is no rebound and no guarding.  Musculoskeletal:  Diffuse tenderness to lumbar spine, upper thoracic, and cervical spine. No overlying skin changes.  No erythema, warmth.  No focal tenderness.  No crepitus or stepoffs.  Diffuse tenderness along the trapezius bilaterally. Able to ambulate without difficulty and gait is normal.  Upper and Lower extremities:  Strength 5/5, sensation intact, distal pulses intact.     Neurological: She is alert. Gait normal.  Skin: She is not diaphoretic.  Nursing note and vitals reviewed.   ED Course  Procedures (including critical care time) DIAGNOSTIC STUDIES: Oxygen Saturation is 100% on RA, nl by my interpretation.    COORDINATION OF CARE: 4:15 PM Discussed treatment plan with pt at bedside which includes robaxin Rx and referral and f/u with orthopedist, and pt agreed to plan.    Labs Review Labs Reviewed - No data to display  Imaging Review No results found.    EKG Interpretation None      MDM   Final diagnoses:  Exacerbation of chronic back pain   Chronic neck and back pain  Muscle strain    Afebrile, nontoxic patient with exacerbation of chronic back pain and muscular soreness from recent heavy lifting.  No red flags with history or exam.  Neurovascularly intact.  Emergent imaging not indicated at this time.   D/C home with robaxin, referrals to orthopedics (her preference), PCP resources.  Discussed result, findings, treatment, and follow up  with patient.  Pt given return precautions.  Pt verbalizes understanding and agrees with plan.         I personally performed the services described in this documentation, which was scribed in my presence. The recorded information has been reviewed and is accurate.    Trixie Dredge, PA-C 06/21/15 1807  Rolland Porter, MD 06/26/15 [phone]

## 2015-06-21 NOTE — Discharge Instructions (Signed)
Read the information below.  Use the prescribed medication as directed.  Please discuss all new medications with your pharmacist.  You may return to the Emergency Department at any time for worsening condition or any new symptoms that concern you.    If you develop fevers, loss of control of bowel or bladder, weakness or numbness in your arms or legs, or are unable to walk, return to the ER for a recheck.    Back Pain, Adult Back pain is very common in adults.The cause of back pain is rarely dangerous and the pain often gets better over time.The cause of your back pain may not be known. Some common causes of back pain include:  Strain of the muscles or ligaments supporting the spine.  Wear and tear (degeneration) of the spinal disks.  Arthritis.  Direct injury to the back. For many people, back pain may return. Since back pain is rarely dangerous, most people can learn to manage this condition on their own. HOME CARE INSTRUCTIONS Watch your back pain for any changes. The following actions may help to lessen any discomfort you are feeling:  Remain active. It is stressful on your back to sit or stand in one place for long periods of time. Do not sit, drive, or stand in one place for more than 30 minutes at a time. Take short walks on even surfaces as soon as you are able.Try to increase the length of time you walk each day.  Exercise regularly as directed by your health care provider. Exercise helps your back heal faster. It also helps avoid future injury by keeping your muscles strong and flexible.  Do not stay in bed.Resting more than 1-2 days can delay your recovery.  Pay attention to your body when you bend and lift. The most comfortable positions are those that put less stress on your recovering back. Always use proper lifting techniques, including:  Bending your knees.  Keeping the load close to your body.  Avoiding twisting.  Find a comfortable position to sleep. Use a firm  mattress and lie on your side with your knees slightly bent. If you lie on your back, put a pillow under your knees.  Avoid feeling anxious or stressed.Stress increases muscle tension and can worsen back pain.It is important to recognize when you are anxious or stressed and learn ways to manage it, such as with exercise.  Take medicines only as directed by your health care provider. Over-the-counter medicines to reduce pain and inflammation are often the most helpful.Your health care provider may prescribe muscle relaxant drugs.These medicines help dull your pain so you can more quickly return to your normal activities and healthy exercise.  Apply ice to the injured area:  Put ice in a plastic bag.  Place a towel between your skin and the bag.  Leave the ice on for 20 minutes, 2-3 times a day for the first 2-3 days. After that, ice and heat may be alternated to reduce pain and spasms.  Maintain a healthy weight. Excess weight puts extra stress on your back and makes it difficult to maintain good posture. SEEK MEDICAL CARE IF:  You have pain that is not relieved with rest or medicine.  You have increasing pain going down into the legs or buttocks.  You have pain that does not improve in one week.  You have night pain.  You lose weight.  You have a fever or chills. SEEK IMMEDIATE MEDICAL CARE IF:   You develop new bowel or  bladder control problems.  You have unusual weakness or numbness in your arms or legs.  You develop nausea or vomiting.  You develop abdominal pain.  You feel faint.   This information is not intended to replace advice given to you by your health care provider. Make sure you discuss any questions you have with your health care provider.   Document Released: 05/24/2005 Document Revised: 06/14/2014 Document Reviewed: 09/25/2013 Elsevier Interactive Patient Education 2016 Elsevier Inc.  Chronic Back Pain  When back pain lasts longer than 3 months, it  is called chronic back pain.People with chronic back pain often go through certain periods that are more intense (flare-ups).  CAUSES Chronic back pain can be caused by wear and tear (degeneration) on different structures in your back. These structures include:  The bones of your spine (vertebrae) and the joints surrounding your spinal cord and nerve roots (facets).  The strong, fibrous tissues that connect your vertebrae (ligaments). Degeneration of these structures may result in pressure on your nerves. This can lead to constant pain. HOME CARE INSTRUCTIONS  Avoid bending, heavy lifting, prolonged sitting, and activities which make the problem worse.  Take brief periods of rest throughout the day to reduce your pain. Lying down or standing usually is better than sitting while you are resting.  Take over-the-counter or prescription medicines only as directed by your caregiver. SEEK IMMEDIATE MEDICAL CARE IF:   You have weakness or numbness in one of your legs or feet.  You have trouble controlling your bladder or bowels.  You have nausea, vomiting, abdominal pain, shortness of breath, or fainting.   This information is not intended to replace advice given to you by your health care provider. Make sure you discuss any questions you have with your health care provider.   Document Released: 07/01/2004 Document Revised: 08/16/2011 Document Reviewed: 11/11/2014 Elsevier Interactive Patient Education 2016 Elsevier Inc.  Radicular Pain Radicular pain in either the arm or leg is usually from a bulging or herniated disk in the spine. A piece of the herniated disk may press against the nerves as the nerves exit the spine. This causes pain which is felt at the tips of the nerves down the arm or leg. Other causes of radicular pain may include:  Fractures.  Heart disease.  Cancer.  An abnormal and usually degenerative state of the nervous system or nerves (neuropathy). Diagnosis may  require CT or MRI scanning to determine the primary cause.  Nerves that start at the neck (nerve roots) may cause radicular pain in the outer shoulder and arm. It can spread down to the thumb and fingers. The symptoms vary depending on which nerve root has been affected. In most cases radicular pain improves with conservative treatment. Neck problems may require physical therapy, a neck collar, or cervical traction. Treatment may take many weeks, and surgery may be considered if the symptoms do not improve.  Conservative treatment is also recommended for sciatica. Sciatica causes pain to radiate from the lower back or buttock area down the leg into the foot. Often there is a history of back problems. Most patients with sciatica are better after 2 to 4 weeks of rest and other supportive care. Short term bed rest can reduce the disk pressure considerably. Sitting, however, is not a good position since this increases the pressure on the disk. You should avoid bending, lifting, and all other activities which make the problem worse. Traction can be used in severe cases. Surgery is usually reserved for patients  who do not improve within the first months of treatment. Only take over-the-counter or prescription medicines for pain, discomfort, or fever as directed by your caregiver. Narcotics and muscle relaxants may help by relieving more severe pain and spasm and by providing mild sedation. Cold or massage can give significant relief. Spinal manipulation is not recommended. It can increase the degree of disc protrusion. Epidural steroid injections are often effective treatment for radicular pain. These injections deliver medicine to the spinal nerve in the space between the protective covering of the spinal cord and back bones (vertebrae). Your caregiver can give you more information about steroid injections. These injections are most effective when given within two weeks of the onset of pain.  You should see your  caregiver for follow up care as recommended. A program for neck and back injury rehabilitation with stretching and strengthening exercises is an important part of management.  SEEK IMMEDIATE MEDICAL CARE IF:  You develop increased pain, weakness, or numbness in your arm or leg.  You develop difficulty with bladder or bowel control.  You develop abdominal pain.   This information is not intended to replace advice given to you by your health care provider. Make sure you discuss any questions you have with your health care provider.   Document Released: 07/01/2004 Document Revised: 06/14/2014 Document Reviewed: 12/18/2014 Elsevier Interactive Patient Education 2016 [employer].    Emergency Department Resource Guide 1) Find a Doctor and Pay Out of Pocket Although you won't have to find out who is covered by your insurance plan, it is a good idea to ask around and get recommendations. You will then need to call the office and see if the doctor you have chosen will accept you as a new patient and what types of options they offer for patients who are self-pay. Some doctors offer discounts or will set up payment plans for their patients who do not have insurance, but you will need to ask so you aren't surprised when you get to your appointment.  2) Contact Your Local Health Department Not all health departments have doctors that can see patients for sick visits, but many do, so it is worth a call to see if yours does. If you don't know where your local health department is, you can check in your phone book. The CDC also has a tool to help you locate your state's health department, and many state websites also have listings of all of their local health departments.  3) Find a Walk-in Clinic If your illness is not likely to be very severe or complicated, you may want to try a walk in clinic. These are popping up all over the country in pharmacies, drugstores, and shopping centers. They're usually  staffed by nurse practitioners or physician assistants that have been trained to treat common illnesses and complaints. They're usually fairly quick and inexpensive. However, if you have serious medical issues or chronic medical problems, these are probably not your best option.  No Primary Care Doctor: - Call Health Connect at  [phone] - they can help you locate a primary care doctor that  accepts your insurance, provides certain services, etc. - Physician Referral Service- [phone]  Chronic Pain Problems: Organization         Address  Phone   Notes  [facility]  [phone] Patients need to be referred by their primary care doctor.   Medication Assistance: Retail buyer  Notes  [facility] Medication [facility] [address] [place], [place] [address] [phone] --Must be a resident of [facility] -- Must have NO insurance coverage whatsoever (no Medicaid/ Medicare, etc.) -- The pt. MUST have a primary care doctor that directs their care regularly and follows them in the community   [facility]ist  [phone]   [employer]  [phone]    Agencies that provide inexpensive medical care: Organization         Address  Phone   Notes  [facility]  [phone]   [facility]    [phone]   [facility] [address] [place], [place] [address] [phone]   [facility] [address], [place] [phone]   Planned Parenthood    [phone]   [facility]    [phone]   Community Health and [facility]  [address], [place] Phone:  [phone], Fax:  [phone] Hours of Operation:  9 am - 6 pm, M-F.  Also accepts Medicaid/Medicare and self-pay.  [facility]  [address], [place] Phone: [phone], Fax: [phone]. Hours of  Operation:  8:30 am - 5:30 pm, M-F.  Also accepts Medicaid and self-pay.  [facility] [place] [address], [place] Phone: [phone]   [facility] [address] Natasha Bence [place], [place] [phone], Ext. [phone] Mondays & Thursdays: 7-9 AM.  First 15 patients are seen on a first come, first serve basis.    Medicaid-accepting [facility] Providers:  Organization         Address  Phone   Notes  [facility] [address], [place] [phone] Also accepts self-pay patients.  [facility] [address] Laurell Josephs [place], [place]  [phone]   [facility] [address], [place] [phone]   [facility] [address], [place] [phone]   Renaye Rakers [address], [place]   [phone] Only accepts [place] patients after they have their name applied to their card.   Self-Pay (no insurance) in [facility]:  Organization         Address  Phone   Notes  Sickle Cell Patients, [facility] [address] [place], [place] [phone]   [facility] [address] [place], [place] [phone]   [facility] [place]  [address], [place] [phone]   [facility]/Dr. Osei-Bonsu  [address] [place] or [address], [place] [phone] Phone number for both [place] and [place] locations is the same.  [facility] and [facility] [address], [place] [phone]   [facility] [address] [place] or [address] [phone]   [facility] [address] [place] [address] [place] [phone], phone; [phone], fax Sees patients 1st and 3rd Saturday of every month.  Must not qualify for public or private insurance (i.e. Medicaid, Medicare,  [place] Health Choice, Veterans' Benefits)  Household income should be no more than 200% of the poverty level The clinic cannot treat you if you are pregnant or think you are pregnant  Sexually transmitted  diseases are not treated at the clinic.    Dental Care: Organization         Address  Phone  Notes  [facility] [address], [place] [phone]) [phone] Accepts children up to age 36 who are enrolled in [place]icaid or [place] Health Choice; pregnant women with a Medicaid card; and children who have applied for Medicaid or [place] Health Choice, but were declined, whose parents can pay a reduced fee at time of service.  [facility] [place]  [address], [place] [phone] Accepts children up to age 36 who are enrolled in [place]icaid or [place] Health Choice; pregnant women with a Medicaid card; and children who have applied for Medicaid or [place] Eye Health Choice, but were declined, whose parents can pay a reduced fee at time of service.  [facility]  [address], [place] [phone]) [phone] Patients are seen by appointment only. Walk-ins are not accepted. [facility] will see patients 36 years of age and older. Monday - Tuesday (8am-5pm) Most Wednesdays (8:30-5pm) $30 per visit, cash only  [facility]  [address], [facility] [place] [phone]) [phone] Patients are seen by appointment only. Walk-ins are not accepted. [facility] will see patients 36 years of age and older. One Wednesday Evening (Monthly: Volunteer Based).  $30 per visit, cash only  [employer] [facility] [facility]  [phone] for adults; Children under age 434, call Graduate Pediatric Dentistry at [phone]. Children aged 214-14, please call [phone] to request a pediatric application.  Dental services are provided in all areas of dental care including fillings, crowns and bridges,  complete and partial dentures, implants, gum treatment, root canals, and extractions. Preventive care is also provided. Treatment is provided to both adults and children. Patients are selected via a lottery and there is often a waiting list.   [facility] [address], [place]  [phone]) [phone] [email or web address]   [facility] [address], [place], [place] [phone], Ext. [phone] Second and Fourth Thursday of each month, opens at 6:30 AM; Clinic ends at 9 AM.  Patients are seen on a first-come first-served basis, and a limited number are seen during each clinic.   [facility]  [address], [place], [place] [phone]) [phone]   Eligibility Requirements You must have lived in [place], [place], or [place] counties for at least the last three months.   You cannot be eligible for state or federal sponsored National Cityhealthcare insurance, including CIGNAVeterans Administration, [place]icaid, or [employer] EntertainmentMedicare.   You generally cannot be eligible for healthcare insurance through your employer.    How to apply: Eligibility screenings are held every Tuesday and Wednesday afternoon from 1:00 pm until 4:00 pm. You do not need an appointment for the interview!  [facility] [address], [place], [place] [phone]   [facility]  [phone]   [facility]  [phone]   [facility]  [phone]    Behavioral Health Resources in the Community: Intensive Outpatient Programs Organization         Address  Phone  Notes  [facility] [address], [place], [place] [phone]   [facility] Outpatient [address], [place], [place] [phone]   [facility] [address], [place], [place]  [phone]  [facility] [address],  [place], [place] [phone] or [phone]   Substance Abuse Resources Organization          Address  Phone  Notes  [facility]  [phone]   [facility]  [phone]   [facility]  [phone]   [facility]  [phone]   Residential & Outpatient Substance Abuse Program  [phone]   Psychological Services Organization         Address  Phone  Notes  [facility]  [phone]- [phone]   [facility]  [phone]- [phone]   [facility] [address], [place] [phone] or [phone]    Mobile Crisis Teams Organization         Address  Phone  Notes  [facility], Mobile Crisis Care Unit  [phone]   [facility]  [address] [place], [place] [phone]   Doristine LocksSharon DeEsch [address] [place] [phone]    Self-Help/Support Groups Organization         Address  Phone             Notes  [facility] of [place] - variety of support groups  [phone] Call for more information  Narcotics Anonymous (NA), Caring [facility] [address], [place]  2 meetings at this location   Statisticianesidential Treatment Programs Organization         Address  Phone  Notes  [facility] [address],    [place]  [phone]   [facility]  [address], [place] [phone], [place], [place] [phone]   [facility] [address], [place] [phone] Admissions: 8am-3pm M-F  [facility] [address]    [place], [place] [phone]   [facility] [address], [place], [place] [phone]   [facility] [address]  [place], [place] [phone]   [facility] - Intensive Outpatient [address], [place], [place] [phone]   [facility] ([facility] [address]  [place], [place] [phone] or [phone]   [facility] ([facility]) [address] [place], [place]  [phone] Accepts Medicaid  [facility] [address]  [place] [phone] Substance Abuse/Addiction Treatment   [facility]al Health Resources Organization         Address  Phone  Notes  [facility]  [phone]) [phone]   Angie FavaJulie Brannon, PhD [address], [address] [place], [place]   [phone](336) [phone] or [phone]   [facility]   [address] [place], [place] [phone]) [phone]   [facility] [address], [place], [place] [phone]) [phone] Insurance/Medicaid/sponsorship through [facility]  [facility] Families [address]                                    [place], [place] [phone] Therapy/tele-psych/case  [facility] [address]   [place], [place] [phone]) [phone]    Dr. Lolly MustacheArfeen  [phone]   [facility] [place]  [employer] [facility] 1) [address], [place] 2) [address], [place] 3)  [address], [place] [phone]) [phone]) [phone]  [phone]   [facility] Child Abuse Hotline [phone](336) [phone] or [phone]) [phone] (After Hours)

## 2015-07-05 ENCOUNTER — Ambulatory Visit (INDEPENDENT_AMBULATORY_CARE_PROVIDER_SITE_OTHER): Payer: Medicare Other

## 2015-07-05 ENCOUNTER — Ambulatory Visit (INDEPENDENT_AMBULATORY_CARE_PROVIDER_SITE_OTHER): Payer: Medicare Other | Admitting: Physician Assistant

## 2015-07-05 VITALS — BP 120/84 | HR 100 | Temp 98.2°F | Resp 16 | Ht 64.0 in | Wt 297.4 lb

## 2015-07-05 DIAGNOSIS — G4700 Insomnia, unspecified: Secondary | ICD-10-CM | POA: Insufficient documentation

## 2015-07-05 DIAGNOSIS — M542 Cervicalgia: Secondary | ICD-10-CM

## 2015-07-05 MED ORDER — METHOCARBAMOL 500 MG PO TABS: 500.0000 mg | ORAL_TABLET | Freq: Four times a day (QID) | ORAL | Status: AC | PRN

## 2015-07-05 MED ORDER — HYDROCODONE-ACETAMINOPHEN 5-325 MG PO TABS: 1.0000 | ORAL_TABLET | Freq: Four times a day (QID) | ORAL | Status: AC | PRN

## 2015-07-05 NOTE — Progress Notes (Signed)
Subjective:   Patient ID: Shelley Bowman, female     DOB: 1980-11-24, 35 y.o.    MRN: [id number]  PCP: Pcp Not In System  Chief Complaint  Patient presents with  . Neck Pain    x 1 day  . Shoulder Pain    both/ x 1 day    HPI  Presents for evaluation of back and shoulder pain.   Pertinent pmhx includes PE, DDD, protein S deficents, chronic anticoagulation, htn and lupus. Chart review reveals other diagnoses that she did not readily share, including DM and OSA. She has recently relocated to [place] from [place], and has not established for primary care.  Presented to the ED on 06/20/15 with posterior neck pain x 4 days, lower back pain and left leg pain x 1 day. Rated as a 8/10. Left without being seen on 06/20/15, due to anticipated long wait and need to get home to her dog, as he regularly causes damage in her apartment if left alone for long periods of time.  Returned the next day, 06/21/15. At that time she reported the pain radiated from neck b/l into her shoulders and left hand/arm that was worsened with movement. She was "currently in the process of moving to the area from [place] and moving furniture exacerbated her pain. Pt notes that she fell and struck her left shoulder against a wall while attempting to sit in a chair and the front leg of the chair gave way - this occurred after her pain began. She states that the fall occurred at the same time as her moving furniture". She secondarily complained of low back pain that radiated to the level of her left knee with associated numbness anteriorly. Pt had been previously evaluated for these symptoms and was dx via MRI by orthopedist in [place] and Rx percocet that alleviated her symptoms in 2013.No imaging was performed in the ED. She was prescribed Robaxin  tablet q6hrs in the ED which she feels only made her fall asleep.   Last night, 07/04/15, she felt a burning sensation at the top of her neck that "shot up into her  head". Currently she has a sharp shooting pain in the middle posterior neck and back, right neck pain to the axially crease, left neck extending into the shoulder and down the arms to the level of the elbow. Pain is constant, rating as an 8/10 and worsened with movement, rating as a 9.5/10. Took tylenol extra strength x3 and notes it did not touch her pain.   Additional complaint of back pain in left hip area that is a shooting pain running down the back of her left leg to the knee area. Had for some time. Notes anatalgic gate.   Upon review of outside problems for reconciliation, she was asked about additional medical history. She reports that she had a sleep study, revealing OSA, but hasn't returned yet for CPAP titration. She is interested in establishing here for primary care.  Prior to Admission medications   Medication Sig Start Date End Date Taking? Authorizing Provider  amLODipine (NORVASC) 5 MG tablet Take 5 mg by mouth daily.   Yes Historical Provider, MD  furosemide (LASIX) 20 MG tablet Take 20 mg by mouth daily as needed. Fluid 10/01/12  Yes Historical Provider, MD  hydroxychloroquine (PLAQUENIL) 200 MG tablet Take 1 tablet (200 mg total) by mouth daily. 05/14/15  Yes Shanker Levora Dredge, MD  metoprolol succinate (TOPROL-XL) 25 MG 24 hr tablet Take 25 mg by  mouth daily.   Yes Historical Provider, MD  QUEtiapine (SEROQUEL) 100 MG tablet Take 100 mg by mouth at bedtime.   Yes Historical Provider, MD  warfarin (COUMADIN) 5 MG tablet Take 10-15 mg by mouth See admin instructions. Alternate taking 2 tablets one day then take 3 tablets the next day   Yes Historical Provider, MD  zolpidem (AMBIEN) 10 MG tablet Take 10 mg by mouth at bedtime as needed for sleep.    Yes Historical Provider, MD  methocarbamol (ROBAXIN) 500 MG tablet Take 1-2 tablets (500-1,000 mg total) by mouth every 6 (six) hours as needed for muscle spasms (and pain). Patient not taking: Reported on 07/05/2015 06/21/15   Trixie Dredge,  PA-C     Allergies  Allergen Reactions  . Ibuprofen     Increased bleeding  . Nsaids Other (See Comments)    "not suppose to take due to being on blood thinners"  . Topamax [Topiramate] Other (See Comments)    "makes face numb"      Patient Active Problem List   Diagnosis Date Noted  . Insomnia 07/05/2015  . Chest pain 05/13/2015  . Acute chest pain 05/13/2015  . HTN (hypertension) 05/13/2015  . Lupus (systemic lupus erythematosus) (HCC) 05/13/2015  . Protein S deficiency (HCC) 05/13/2015  . Chronic anticoagulation 05/13/2015  . Morbid obesity (HCC)      Family History  Problem Relation Age of Onset  . Hypertension Father      Social History   Social History  . Marital Status: Married    Spouse Name: N/A  . Number of Children: N/A  . Years of Education: N/A   Occupational History  . Disabled     "lupus and bleeding disorder"    Social History Main Topics  . Smoking status: Former Games developer  . Smokeless tobacco: Not on file     Comment: Smoked for 11 years, quit ~2012  . Alcohol Use: No  . Drug Use: No  . Sexual Activity: Not on file   Other Topics Concern  . Not on file   Social History Narrative        Review of Systems Constitutional: Negative for fatigue.  Eyes: Positive for pain (left). Negative for visual disturbance.  Respiratory: Negative for cough, chest tightness, shortness of breath, wheezing and stridor.  Cardiovascular: Negative for chest pain, palpitations and leg swelling.  Neurological: Negative for numbness.   Denies numbness, tingling, weakness, loss of bowel or bladder function, or saddle anethesia.       Objective:  Physical Exam  Constitutional: She is oriented to person, place, and time. Vital signs are normal. She appears well-developed and well-nourished. She is active and cooperative. No distress.  BP 120/84 mmHg  Pulse 100  Temp(Src) 98.2 F (36.8 C) (Oral)  Resp 16  Ht  (1.626 m)  Wt 297 lb 6.4 oz  (134.9 kg)  BMI 51.02 kg/m2  SpO2 99%  HENT:  Head: Normocephalic and atraumatic.  Right Ear: Hearing normal.  Left Ear: Hearing normal.  Eyes: Conjunctivae are normal. No scleral icterus.  Neck: Normal range of motion. Neck supple. No thyromegaly present.  Cardiovascular: Normal rate, regular rhythm and normal heart sounds.   Pulses:      Radial pulses are 2+ on the right side, and 2+ on the left side.  Trace pitting edema of the bilateral LE.  Pulmonary/Chest: Effort normal and breath sounds normal.  Musculoskeletal:  She is sitting with stiff, erect posture, holding the LEFT arm across  her body anteriorly. She refuses to move from the chair to the exam table. She is exquisitely tender to very light touch generally over the neck and upper back. Palpable spasm noted in the trapezius, L>R.  Lymphadenopathy:       Head (right side): No tonsillar, no preauricular, no posterior auricular and no occipital adenopathy present.       Head (left side): No tonsillar, no preauricular, no posterior auricular and no occipital adenopathy present.    She has no cervical adenopathy.       Right: No supraclavicular adenopathy present.       Left: No supraclavicular adenopathy present.  Neurological: She is alert and oriented to person, place, and time. No sensory deficit.  Skin: Skin is warm, dry and intact. No rash noted. No cyanosis or erythema. Nails show no clubbing.  Psychiatric: She has a normal mood and affect.     Dg Cervical Spine Complete  07/05/2015  CLINICAL DATA:  35 year old female with cervical spine and neck pain. EXAM: CERVICAL SPINE - COMPLETE 4+ VIEW COMPARISON:  None. FINDINGS: There is no evidence of cervical spine fracture or prevertebral soft tissue swelling. Alignment is normal. No other significant bone abnormalities are identified. IMPRESSION: Negative cervical spine radiographs. Electronically Signed   By: Harmon Pier M.D.   On: 07/05/2015 15:26       Assessment & Plan:    1. Neck pain Reassured with normal c-spine radiographs. Continue robaxin. Add short course of opiate for pain relief. Elect to avoid prednisone, as it appears that she has diabetes and is un-treated. Encouraged her to schedule CPAP titration as soon as possible, as poor sleep may be contributing to multiple components of her current health problems. - DG Cervical Spine Complete; Future - HYDROcodone-acetaminophen (NORCO) 5-325 MG tablet; Take 1-2 tablets by mouth every 6 (six) hours as needed.  Dispense: 30 tablet; Refill: 0 - methocarbamol (ROBAXIN) 500 MG tablet; Take 1-2 tablets (500-1,000 mg total) by mouth every 6 (six) hours as needed for muscle spasms (and pain).  Dispense: 60 tablet; Refill: 0   Return if symptoms worsen or fail to improve. She is welcome to return here for primary care, and was advised to either sign for release of records today, or bring her records to her next visit. Full reconciliation of her record will be needed at that time.   Fernande Bras, PA-C Physician Assistant-Certified [facility]

## 2015-07-05 NOTE — Patient Instructions (Addendum)
Because you received an x-ray today, you will receive an invoice from [facility]. Please contact [facility] at [phone] with questions or concerns regarding your invoice. Our billing staff will not be able to assist you with those questions.  Use a heating pad or warm compress for 15-20 minutes 2-4 times each day. Be sure to put the heating pad on you, not you on the heating pad, as the pressure can cause a burn.  Please schedule the CPAP titration as soon as possible.  Please get your records from your previous provider so that we can make sure we keep your care on track.

## 2015-07-05 NOTE — Progress Notes (Signed)
Subjective:    Patient ID: Shelley Bowman, female    DOB: 1981-01-22, 35 y.o.   MRN: [id number]  Chief Complaint  Patient presents with  . Neck Pain    x 1 day  . Shoulder Pain    both/ x 1 day    HPI Presents today for back and shoulder pain. Pertinent pmhx includes PE, DDD, protein S deficents, chronic anticoagulation, htn and lupus. Presented to the ED on 06/20/15 with posterior neck pain, lower back pain and left leg pain x 1 day. Rated as a 8/10. Left without being seen on 06/20/15. Returned the next day, 06/21/15. At that time she pain radiated from neck b/l into her shoulders and left hand/arm that was worsened with movement. She was "currently in the process of moving to the area from [place] and moving furniture exacerbated her pain. Pt notes that she fell and struck her left shoulder against a wall while attempting to sit in a chair and the front leg of the chair gave way - this occurred after her pain began. She states that the fall occurred at the same time as her moving furniture". No imaging was done when seen in the ED.   Yesterday night, 07/04/15, she felt a burning sensation at the top of her neck that "shot up into her head". Currently she has a sharp shooting pain in the middle posterior neck and back, right neck pain to the axially crease, left neck extending into the shoulder and down the arms to the level of the elbow. Pain is constant, rating as an 8/10  and worsened with movement, rating as a 9.5/10. Took tylenol extra strength x3 and notes it did not touch her pain. She was prescribed Robaxin  tablet q6hrs in the ED which she feels only made her fall asleep.   Additional complaint of back pain in left hip area that is a shooting pain running down the back of her left leg to the knee area. Had for some time. Notes anatalgic gate.   Noted from ED: Shelley Bowman is a 35 y.o. female with a medical hx of DDD, pro who presents to the Emergency Department  complaining of constant radiating neck pain onset 4 days ago. She reports that her neck pain radiates to her bilateral shoulders and left hand/arm and her pain is worsened with movement. She notes that she currently in the process of moving to the area from [place] and moving furniture exacerbated her pain. Pt notes that she fell and struck her left shoulder against a wall while attempting to sit in a chair and the front leg of the chair gave way - this occurred after her pain began. She states that the fall occurred at the same time as her moving furniture. She secondarily complains of low back pain that radiates to the level of her left knee with associated numbness anteriorly. Pt has been seen for these symptoms on the past and was dx via MRI by orthopedist in [place] and Rx percocet that alleviated her symptoms in 2013. She states that she has tried extra strength tylenol with no relief for her symptoms. Pt denies bowel/bladder incontinence, fever, CP, SOB, abdominal pain, n/v/d/c, hematuria, dysuria, weakness, leg swelling, and any other symptoms. She reports that she can not take ibuprofen, NSAIDs or topomax due to her coumadin use for her protein S deficiency.   Review of Systems  Constitutional: Negative for fatigue.  Eyes: Positive for pain (left). Negative for visual disturbance.  Respiratory: Negative for cough, chest tightness, shortness of breath, wheezing and stridor.   Cardiovascular: Negative for chest pain, palpitations and leg swelling.  Neurological: Negative for numbness.       Denies numbness, tingling, weakness, loss of bowel or bladder function, or saddle anethesia.    Allergies  Allergen Reactions  . Ibuprofen     Increased bleeding  . Nsaids Other (See Comments)    "not suppose to take due to being on blood thinners"  . Topamax [Topiramate] Other (See Comments)    "makes face numb"    Prior to Admission medications   Medication Sig Start Date End Date Taking?  Authorizing Provider  amLODipine (NORVASC) 5 MG tablet Take 5 mg by mouth daily.   Yes Historical Provider, MD  furosemide (LASIX) 20 MG tablet Take 20 mg by mouth daily as needed. Fluid 10/01/12  Yes Historical Provider, MD  hydroxychloroquine (PLAQUENIL) 200 MG tablet Take 1 tablet (200 mg total) by mouth daily. 05/14/15  Yes Shanker Levora Dredge, MD  metoprolol succinate (TOPROL-XL) 25 MG 24 hr tablet Take 25 mg by mouth daily.   Yes Historical Provider, MD  QUEtiapine (SEROQUEL) 100 MG tablet Take 100 mg by mouth at bedtime.   Yes Historical Provider, MD  warfarin (COUMADIN) 5 MG tablet Take 10-15 mg by mouth See admin instructions. Alternate taking 2 tablets one day then take 3 tablets the next day   Yes Historical Provider, MD  zolpidem (AMBIEN) 10 MG tablet Take 10 mg by mouth at bedtime as needed for sleep.    Yes Historical Provider, MD                 Patient Active Problem List   Diagnosis Date Noted  . Insomnia 07/05/2015  . Chest pain 05/13/2015  . Acute chest pain 05/13/2015  . HTN (hypertension) 05/13/2015  . Lupus (systemic lupus erythematosus) (HCC) 05/13/2015  . Protein S deficiency (HCC) 05/13/2015  . Chronic anticoagulation 05/13/2015  . Morbid obesity (HCC)       Objective:   Physical Exam  Constitutional: She appears well-developed and well-nourished. She appears distressed.  BP 120/84 mmHg  Pulse 100  Temp(Src) 98.2 F (36.8 C) (Oral)  Resp 16  Ht  (1.626 m)  Wt 297 lb 6.4 oz (134.9 kg)  BMI 51.02 kg/m2  SpO2 99%   HENT:  Head: Normocephalic and atraumatic.  Right Ear: External ear normal.  Left Ear: External ear normal.  Mouth/Throat: Oropharynx is clear and moist.  Eyes: Conjunctivae and EOM are normal. Pupils are equal, round, and reactive to light.  Neck: Neck supple.  Cardiovascular: Normal rate, regular rhythm, S1 normal, S2 normal, normal heart sounds and normal pulses.  Exam reveals no gallop, no S4 and no friction rub.   Slight pitting  edema Negative homans b/l  Musculoskeletal:       Right shoulder: She exhibits pain.       Left shoulder: She exhibits pain.       Cervical back: She exhibits decreased range of motion, tenderness, bony tenderness, pain and spasm.  Neurological: She is alert. She has normal strength and normal reflexes. Cranial nerve deficit: CN 2-10 & 12 intact.  Skin: Skin is warm and dry. No bruising and no ecchymosis noted. No erythema.  Vitals reviewed.  Dg Cervical Spine Complete  07/05/2015  CLINICAL DATA:  35 year old female with cervical spine and neck pain. EXAM: CERVICAL SPINE - COMPLETE 4+ VIEW COMPARISON:  None. FINDINGS: There is no evidence of  cervical spine fracture or prevertebral soft tissue swelling. Alignment is normal. No other significant bone abnormalities are identified. IMPRESSION: Negative cervical spine radiographs. Electronically Signed   By: Harmon Pier M.D.   On: 07/05/2015 15:26       Assessment & Plan:  1. Neck pain - DG Cervical Spine Complete; Future - HYDROcodone-acetaminophen (NORCO) 5-325 MG tablet; Take 1-2 tablets by mouth every 6 (six) hours as needed.  Dispense: 30 tablet; Refill: 0 - methocarbamol (ROBAXIN) 500 MG tablet; Take 1-2 tablets (500-1,000 mg total) by mouth every 6 (six) hours as needed for muscle spasms (and pain).  Dispense: 60 tablet; Refill: 0  Discussed with patient the option of establishing office as her primary care.   Return if symptoms worsen or fail to improve.

## 2015-07-07 ENCOUNTER — Encounter: Payer: Self-pay | Admitting: Physician Assistant

## 2016-12-05 IMAGING — CT CT ANGIO CHEST
2 of 9 series · 18 of 46 positions shown · IV contrast (OMNI)
Comparison: Chest radiograph dated 05/12/2015

CLINICAL DATA: 34-year-old female with intermittent chest pain and
shortness of breath

EXAM:
CT ANGIOGRAPHY CHEST WITH CONTRAST
TECHNIQUE: Multidetector CT imaging of the chest was performed using the
standard protocol during bolus administration of intravenous
contrast. Multiplanar CT image reconstructions and MIPs were
obtained to evaluate the vascular anatomy.
CONTRAST:  100mL OMNIPAQUE IOHEXOL 350 MG/ML SOLN

[Series 5: thins · axial · 0.63mm/px · z∈[-135,+65]mm · 15 of 226 slices shown]
[im 13/226  lung]
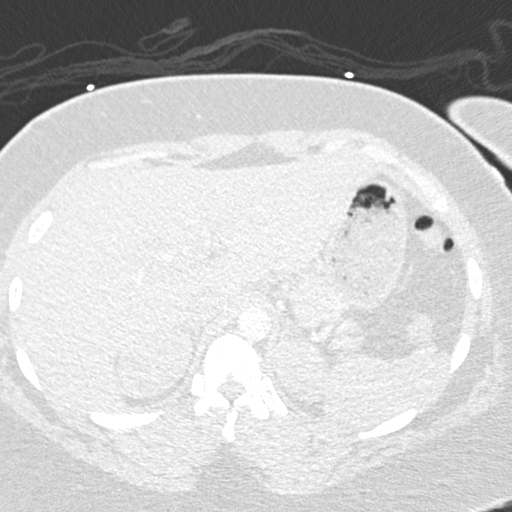
[im 26/226  soft-tissue]
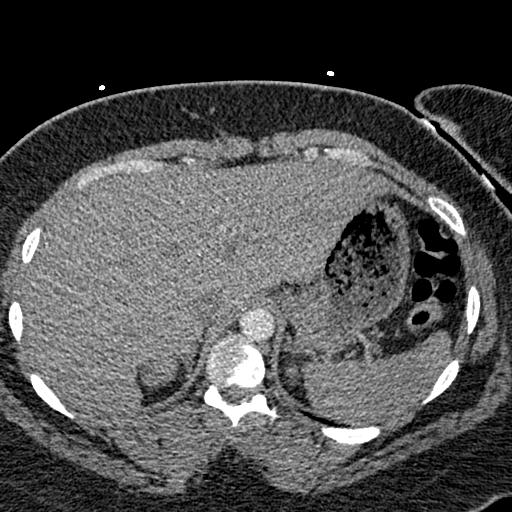
[im 38/226  lung]
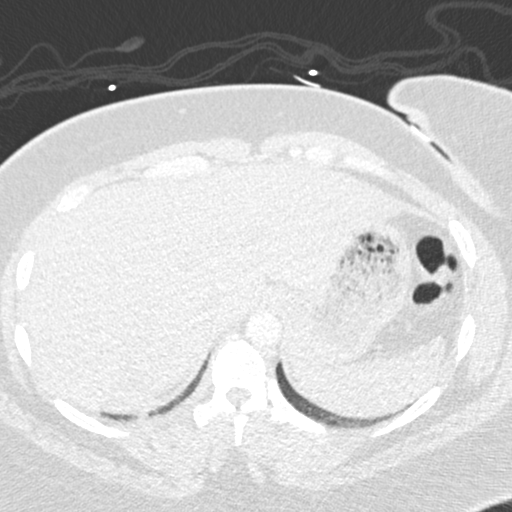
[im 51/226  soft-tissue]
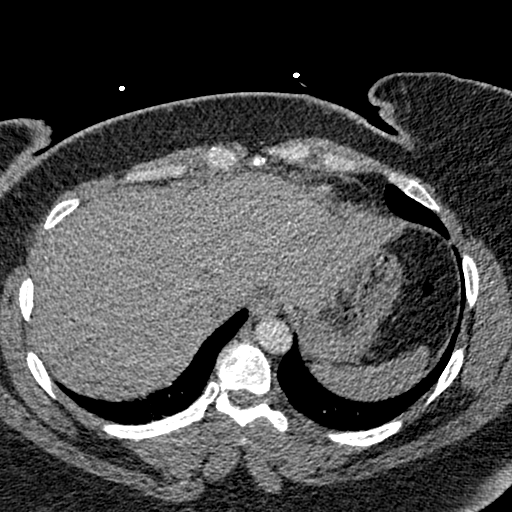
[im 76/226  lung]
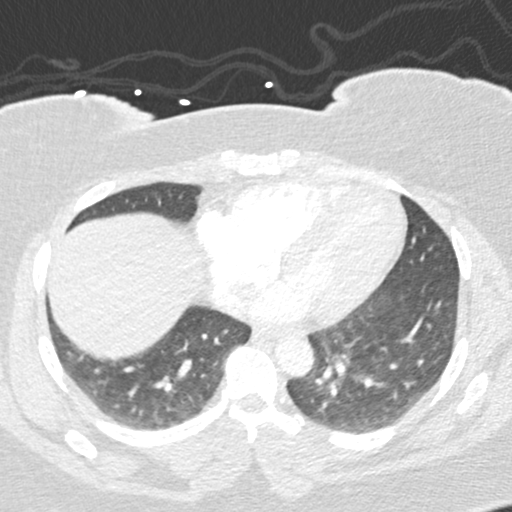
[im 88/226  soft-tissue]
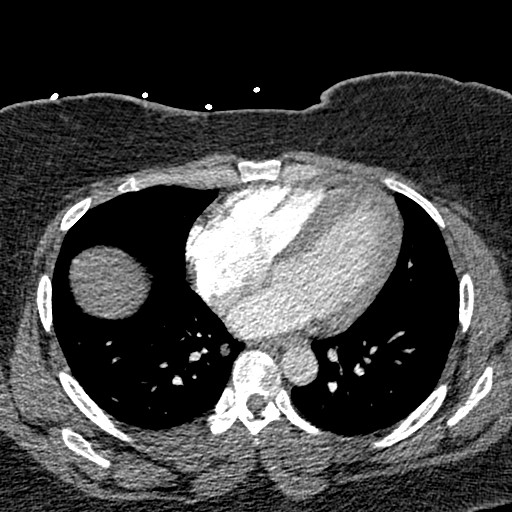
[im 101/226  lung]
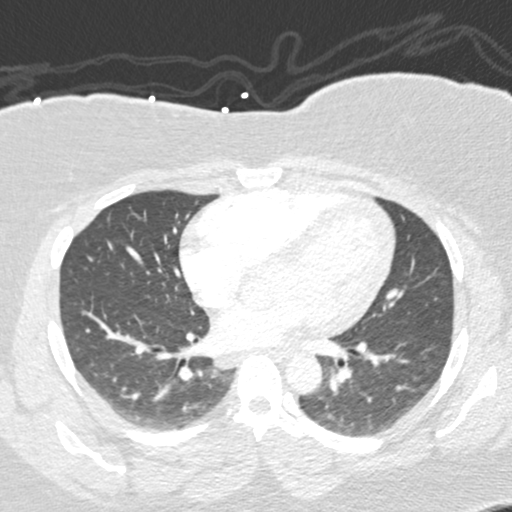
[im 113/226  soft-tissue]
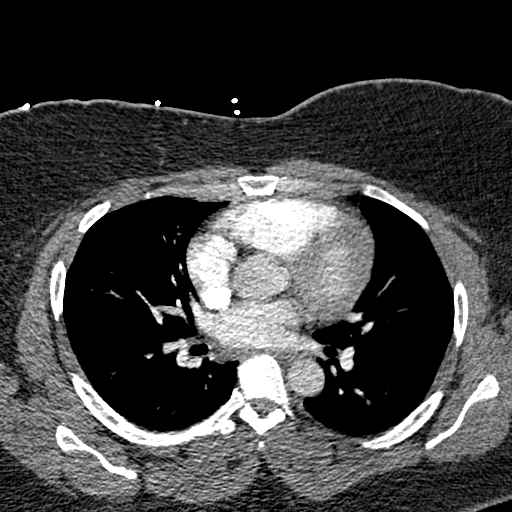
[im 126/226  lung]
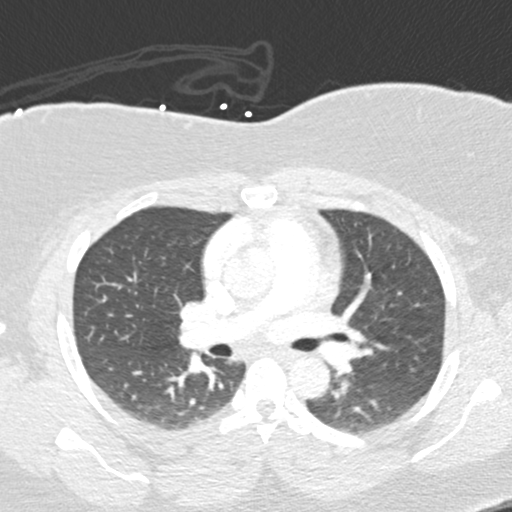
[im 138/226  soft-tissue]
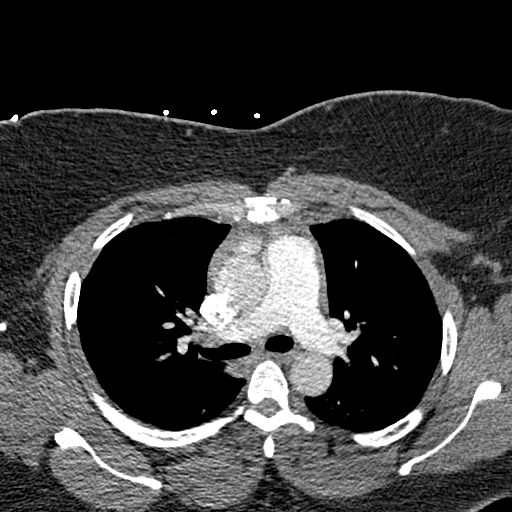
[im 151/226  lung]
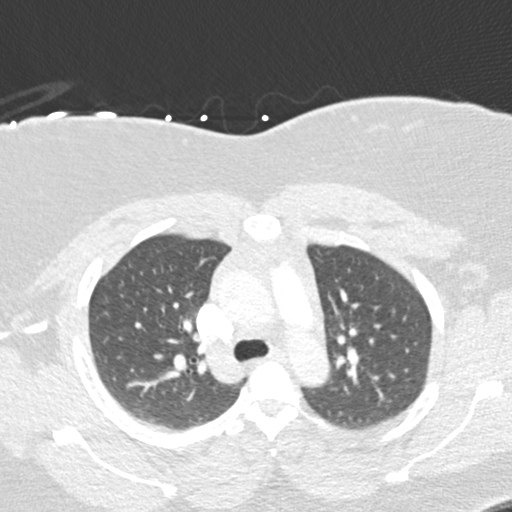
[im 176/226  soft-tissue]
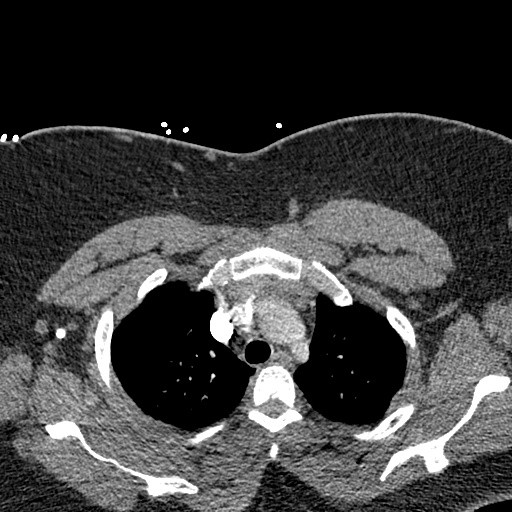
[im 188/226  lung]
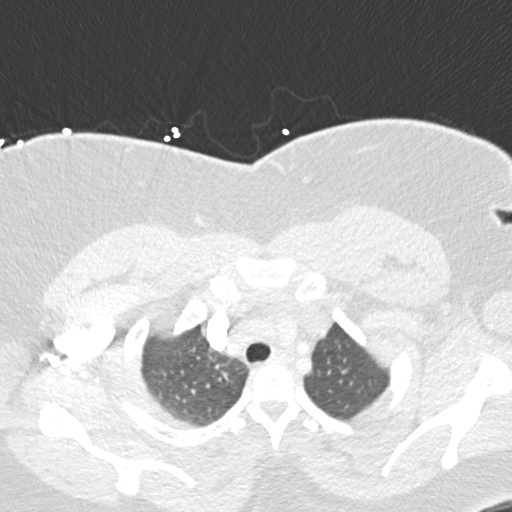
[im 201/226  soft-tissue]
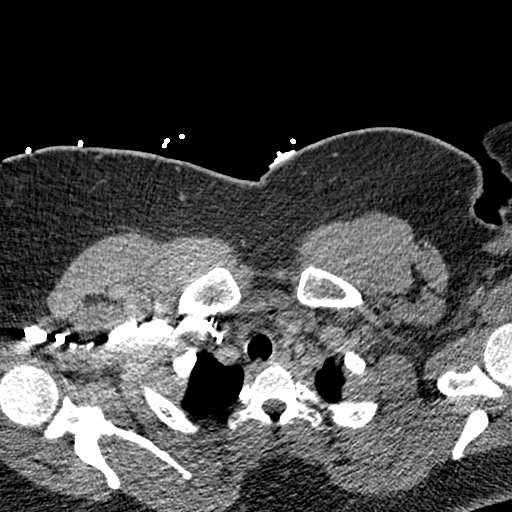
[im 213/226  lung]
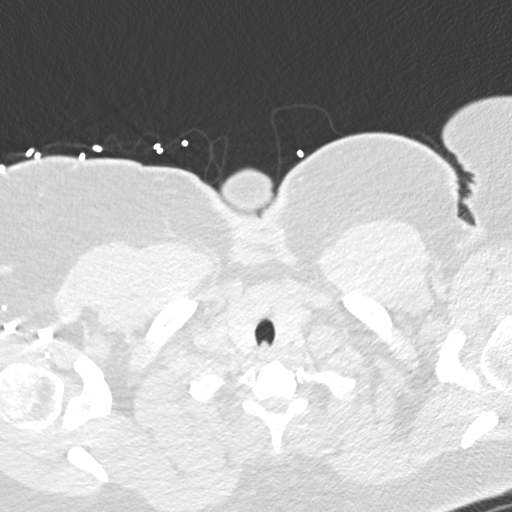

[Series 7: coronal mpr · coronal · 0.47mm/px · 3 of 106 slices shown]
[im 27/106  soft-tissue]
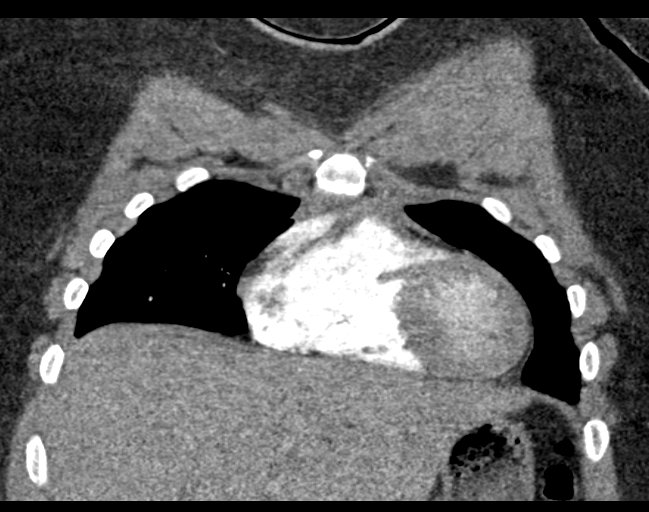
[im 53/106  soft-tissue]
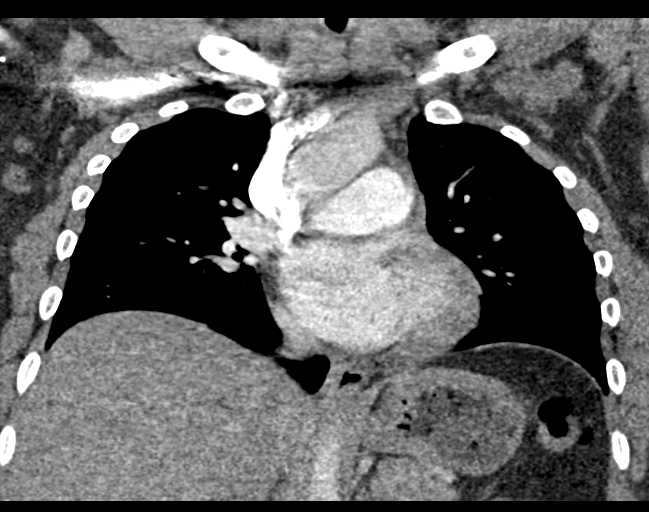
[im 79/106  soft-tissue]
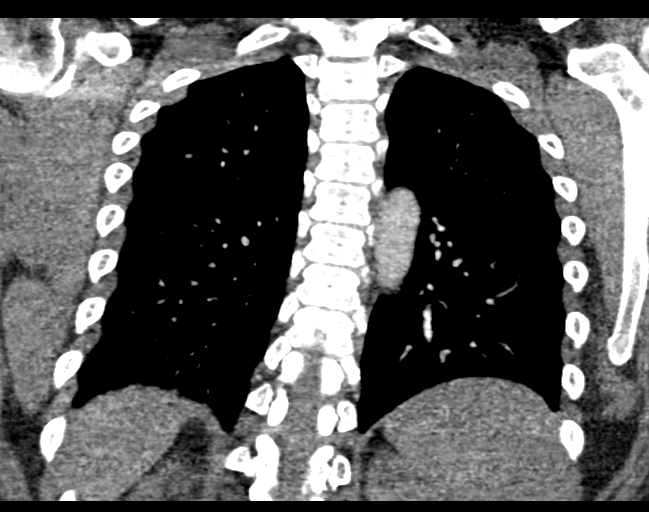

[18 of 46 positions shown; findings below may reference images not displayed]

FINDINGS: The lungs are clear. No pleural effusion. The central airways are
patent.

The thoracic aorta appears unremarkable. Evaluation of the pulmonary
arteries is limited due to suboptimal opacification of the
peripheral branches. No central pulmonary artery embolus identified.
There is prominence of the main pulmonary trunk concerning for a
degree of pulmonary hypertension. Top-normal cardiac size. No
pericardial effusion. The visualized esophagus and thyroid gland
appear unremarkable.

There is no axillary adenopathy. The chest wall soft tissues appear
unremarkable. The osseous structures are intact. The visualized
upper abdomen appear unremarkable.

Review of the MIP images confirms the above findings.
IMPRESSION: Suboptimal opacification of the peripheral pulmonary arteries. No CT
evidence of central pulmonary artery embolus.
